# Patient Record
Sex: Male | Born: 1960
Health system: Southern US, Community
[De-identification: ages and names within clinical notes are randomized; demographics above are authoritative.]

## PROBLEM LIST (undated history)

## (undated) DIAGNOSIS — Z905 Acquired absence of kidney: Secondary | ICD-10-CM

---

## 1993-06-07 HISTORY — PX: NEPHRECTOMY LIVING DONOR: SUR877

## 2000-09-09 ENCOUNTER — Encounter: Admission: RE | Admit: 2000-09-09 | Discharge: 2000-09-09 | Payer: Self-pay | Admitting: Internal Medicine

## 2000-09-09 ENCOUNTER — Ambulatory Visit (HOSPITAL_COMMUNITY): Admission: RE | Admit: 2000-09-09 | Discharge: 2000-09-09 | Payer: Self-pay | Admitting: Internal Medicine

## 2000-09-09 ENCOUNTER — Encounter: Payer: Self-pay | Admitting: Internal Medicine

## 2000-10-07 ENCOUNTER — Encounter (INDEPENDENT_AMBULATORY_CARE_PROVIDER_SITE_OTHER): Payer: Self-pay | Admitting: Specialist

## 2000-10-07 ENCOUNTER — Ambulatory Visit (HOSPITAL_COMMUNITY): Admission: RE | Admit: 2000-10-07 | Discharge: 2000-10-07 | Payer: Self-pay | Admitting: Urology

## 2000-12-30 ENCOUNTER — Emergency Department (HOSPITAL_COMMUNITY): Admission: EM | Admit: 2000-12-30 | Discharge: 2000-12-30 | Payer: Self-pay

## 2000-12-30 ENCOUNTER — Encounter: Payer: Self-pay | Admitting: Emergency Medicine

## 2002-06-07 HISTORY — PX: SURGERY SCROTAL / TESTICULAR: SUR1316

## 2004-08-06 ENCOUNTER — Emergency Department (HOSPITAL_COMMUNITY): Admission: EM | Admit: 2004-08-06 | Discharge: 2004-08-06 | Payer: Self-pay | Admitting: Emergency Medicine

## 2006-02-06 ENCOUNTER — Emergency Department (HOSPITAL_COMMUNITY): Admission: EM | Admit: 2006-02-06 | Discharge: 2006-02-06 | Payer: Self-pay | Admitting: Emergency Medicine

## 2008-09-25 ENCOUNTER — Emergency Department (HOSPITAL_COMMUNITY): Admission: EM | Admit: 2008-09-25 | Discharge: 2008-09-25 | Payer: Self-pay | Admitting: Emergency Medicine

## 2010-10-23 NOTE — Op Note (Signed)
[facility]  Patient:    Dustin Perry, Dustin Perry                MRN: [id number] Proc. Date: 10/07/00 Adm. Date:  [id number] Attending:  Trisha Mangle                           Operative Report  PREOPERATIVE DIAGNOSIS:  Right scrotal mass.  POSTOPERATIVE DIAGNOSIS:  Benign adenomatoid tumor.  PROCEDURE:  Excision of right scrotal mass.  SURGEON:  Mark C. Vernie Ammons, M.D.  ANESTHESIA:  General.  DRAINS:  None.  ESTIMATED BLOOD LOSS:  Less than 5 cc.  SPECIMEN:  Lesion was sent to pathology.  COMPLICATIONS:  None.  INDICATIONS:  The patient is a 50 year old male with a mass felt in the lower pole of his right hemiscrotum.  It was found on ultrasound to be approximately 1 cm in size, round, and well circumscribed but solid, most consistent with some form of neoplasm.  Benign lesions are felt to be the most likely cause, but the possibility of malignancy was present.  This was discussed with the patient, and he has elected to proceed with surgical excision.  DESCRIPTION OF PROCEDURE:  After informed consent, the patient went to the major OR, placed on the table, and administered general anesthesia.  The scrotum and genitalia were sterilely prepped and draped, and a midline median raphe incision was made.  The right hemiscrotum was then entered and the tunica vaginalis and some mild inflammation that was somewhat adherent to the testicle.  I was able to palpable the lesion in the inferior pole associated fairly intimately with the tail of the epididymis. I therefore incised the tunica vaginalis over this region and used sharp and blunt technique to isolate the lesion.  I tied off all vascular channels leading to the area with 2-0 silk suture.  The lesion was excised and sent to pathology, and frozen section returned benign adenomatoid tumor.  Bleeding points were cauterized, and I closed the parietal tunica vaginalis with running locking 3-0  Vicryl suture.  I then injected 0.5% plain Marcaine in the subcutaneous tissue and closed that with a running locking 3-0 chromic suture.  The skin was closed with running 3-0 chromic.  Sterile occlusive dressing was applied as well as fluff 4 x 4s and a scrotal support.  The patient was awakened and taken to the recovery room in stable and satisfactory condition.  He tolerated the procedure well, and there were intraoperative complications.  He will be given a prescription for #40 Tylox and return to my office for a recheck in approximately four weeks. DD:  10/07/00 TD:  10/07/00 Job: [id number]

## 2011-03-25 ENCOUNTER — Inpatient Hospital Stay (INDEPENDENT_AMBULATORY_CARE_PROVIDER_SITE_OTHER)
Admission: RE | Admit: 2011-03-25 | Discharge: 2011-03-25 | Disposition: A | Discharge status: Home / Self Care | Payer: Self-pay | Source: Ambulatory Visit | Attending: Family Medicine | Admitting: Family Medicine

## 2011-03-25 DIAGNOSIS — K4090 Unilateral inguinal hernia, without obstruction or gangrene, not specified as recurrent: Secondary | ICD-10-CM

## 2011-03-25 LAB — POCT URINALYSIS DIP (DEVICE)
Bilirubin Urine: NEGATIVE
Glucose, UA: NEGATIVE mg/dL
Ketones, ur: NEGATIVE mg/dL
Leukocytes, UA: NEGATIVE
Nitrite: NEGATIVE
Protein, ur: 30 mg/dL — AB
Specific Gravity, Urine: >=1.03 (ref 1.005–1.030)
Urobilinogen, UA: 0.2 mg/dL (ref 0.0–1.0)
pH: 5.5 (ref 5.0–8.0)

## 2011-03-26 ENCOUNTER — Encounter (INDEPENDENT_AMBULATORY_CARE_PROVIDER_SITE_OTHER): Payer: Self-pay | Admitting: Surgery

## 2011-03-29 ENCOUNTER — Ambulatory Visit (INDEPENDENT_AMBULATORY_CARE_PROVIDER_SITE_OTHER): Payer: Self-pay | Admitting: Surgery

## 2011-04-05 ENCOUNTER — Ambulatory Visit (INDEPENDENT_AMBULATORY_CARE_PROVIDER_SITE_OTHER): Payer: Self-pay | Admitting: Surgery

## 2011-04-10 ENCOUNTER — Encounter (HOSPITAL_COMMUNITY): Payer: Self-pay | Admitting: Radiology

## 2011-04-10 ENCOUNTER — Emergency Department (HOSPITAL_COMMUNITY)
Admission: EM | Admit: 2011-04-10 | Discharge: 2011-04-10 | Disposition: A | Discharge status: Home / Self Care | Payer: Self-pay | Attending: Emergency Medicine | Admitting: Emergency Medicine

## 2011-04-10 ENCOUNTER — Emergency Department (HOSPITAL_COMMUNITY): Payer: Self-pay

## 2011-04-10 DIAGNOSIS — K4091 Unilateral inguinal hernia, without obstruction or gangrene, recurrent: Secondary | ICD-10-CM | POA: Insufficient documentation

## 2011-04-10 DIAGNOSIS — R109 Unspecified abdominal pain: Secondary | ICD-10-CM | POA: Insufficient documentation

## 2011-04-10 HISTORY — DX: Acquired absence of kidney: Z90.5

## 2011-04-10 LAB — URINALYSIS, ROUTINE W REFLEX MICROSCOPIC
Hgb urine dipstick: NEGATIVE
Nitrite: NEGATIVE
Specific Gravity, Urine: 1.017 (ref 1.005–1.030)
Urobilinogen, UA: 0.2 mg/dL (ref 0.0–1.0)
pH: 6 (ref 5.0–8.0)

## 2011-04-10 LAB — DIFFERENTIAL
Basophils Absolute: 0.1 10*3/uL (ref 0.0–0.1)
Basophils Relative: 1 % (ref 0–1)
Lymphocytes Relative: 34 % (ref 12–46)
Monocytes Absolute: 0.7 10*3/uL (ref 0.1–1.0)
Neutro Abs: 5.1 10*3/uL (ref 1.7–7.7)
Neutrophils Relative %: 53 % (ref 43–77)

## 2011-04-10 LAB — BASIC METABOLIC PANEL
BUN: 15 mg/dL (ref 6–23)
Chloride: 101 mEq/L (ref 96–112)
GFR calc Af Amer: >90 mL/min (ref >90)
GFR calc non Af Amer: >90 mL/min (ref >90)
Glucose, Bld: 101 mg/dL — ABNORMAL HIGH (ref 70–99)
Potassium: 3.5 mEq/L (ref 3.5–5.1)
Sodium: 136 mEq/L (ref 135–145)

## 2011-04-10 LAB — CBC
HCT: 39.2 % (ref 39.0–52.0)
Hemoglobin: 14.3 g/dL (ref 13.0–17.0)
MCHC: 36.5 g/dL — ABNORMAL HIGH (ref 30.0–36.0)
WBC: 9.6 10*3/uL (ref 4.0–10.5)

## 2011-04-10 MED ORDER — IOHEXOL 300 MG/ML  SOLN
100.0000 mL | Freq: Once | INTRAMUSCULAR | Status: AC | PRN
  Administered 2011-04-10: 100 mL via INTRAVENOUS

## 2011-04-26 ENCOUNTER — Ambulatory Visit (INDEPENDENT_AMBULATORY_CARE_PROVIDER_SITE_OTHER): Payer: Self-pay | Admitting: Surgery

## 2011-04-26 ENCOUNTER — Encounter (INDEPENDENT_AMBULATORY_CARE_PROVIDER_SITE_OTHER): Payer: Self-pay | Admitting: Surgery

## 2011-04-26 VITALS — BP 116/82 | HR 64 | Temp 97.6°F | Resp 18 | Ht 66.0 in | Wt 183.5 lb

## 2011-04-26 DIAGNOSIS — K4090 Unilateral inguinal hernia, without obstruction or gangrene, not specified as recurrent: Secondary | ICD-10-CM

## 2011-04-26 NOTE — Progress Notes (Signed)
Patient ID: Dustin Perry, male   DOB: Dec 08, 1960, 50 y.o.   MRN: [id number]  Chief Complaint  Patient presents with  . New Evaluation    eval of LIH     HPI Dustin Perry is a 50 y.o. male.   HPIThis is a gentleman referred by Dr. Angelia Mould for evaluation of left groin pain. The patient then having severe pain in his groin intermittently for over a month. He has not noticed a bulge himself. The pain is sharp and refer to his back as well as his left testicle. He has had no nausea vomiting or obstructive symptoms.  Past Medical History  Diagnosis Date  . History of nephrectomy     left, for donation    Past Surgical History  Procedure Date  . Nephrectomy living donor 9  . Surgery scrotal / testicular 2004    fatty mass removed     History reviewed. No pertinent family history.  Social History History  Substance Use Topics  . Smoking status: Never Smoker   . Smokeless tobacco: Never Used  . Alcohol Use: Yes    No Known Allergies  Current Outpatient Prescriptions  Medication Sig Dispense Refill  . Hydrocodone-Acetaminophen (VICODIN PO) Take by mouth. 5/300 mg 1 q 6 hr prn pain         Review of Systems Review of Systems  Constitutional: Negative for fever, chills and unexpected weight change.  HENT: Negative for hearing loss, congestion, sore throat, trouble swallowing and voice change.   Eyes: Negative for visual disturbance.  Respiratory: Negative for cough and wheezing.   Cardiovascular: Negative for chest pain, palpitations and leg swelling.  Gastrointestinal: Positive for abdominal pain. Negative for nausea, vomiting, diarrhea, constipation, blood in stool, abdominal distention, anal bleeding and rectal pain.  Genitourinary: Negative for hematuria and difficulty urinating.  Musculoskeletal: Negative for arthralgias.  Skin: Negative for rash and wound.  Neurological: Negative for seizures, syncope, weakness and headaches.  Hematological: Negative  for adenopathy. Does not bruise/bleed easily.  Psychiatric/Behavioral: Negative for confusion.    Blood pressure 116/82, pulse 64, temperature 97.6 F (36.4 C), temperature source Temporal, resp. rate 18, height 5\' 6"  (1.676 m), weight 183 lb 8 oz (83.235 kg).  Physical Exam Physical Exam  Constitutional: He is oriented to person, place, and time. He appears well-developed and well-nourished. No distress.  HENT:  Head: Normocephalic and atraumatic.  Right Ear: External ear normal.  Left Ear: External ear normal.  Nose: Nose normal.  Mouth/Throat: Oropharynx is clear and moist.  Eyes: Conjunctivae are normal. Pupils are equal, round, and reactive to light. No scleral icterus.  Neck: Normal range of motion. Neck supple. No JVD present. No tracheal deviation present. No thyromegaly present.  Cardiovascular: Normal rate, regular rhythm, normal heart sounds and intact distal pulses.   No murmur heard. Pulmonary/Chest: Effort normal and breath sounds normal. No respiratory distress. He has no wheezes.  Abdominal: Soft. Bowel sounds are normal. He exhibits no distension and no mass. There is no rebound and no guarding.       He has an easily reducible left inguinal hernia which is mildly tender. There is no evidence of right inguinal hernia. He has a well-healed large midline abdominal incision  Musculoskeletal: Normal range of motion. He exhibits no edema.  Lymphadenopathy:    He has no cervical adenopathy.  Neurological: He is alert and oriented to person, place, and time.  Skin: Skin is warm and dry. No rash noted. No erythema.  Psychiatric:  His behavior is normal. Judgment normal.    Data Reviewed I had a CAT scan of the abdomen and pelvis showing a left inguinal hernia containing only fat. He has had a previous nephrectomy. There are no other abnormalities  Assessment    Easily reducible left inguinal hernia    Plan    As he is quite symptomatic, repair is recommended with  mesh. Given his previous surgery I will need to do this open. I discussed the risks of surgery which includes but is not limited to bleeding, infection, injury to showing structures, nerve entrapment, chronic pain, recurrence, etc. He understands and wishes to proceed. Likelihood of success is good.       Kelty Szafran A 04/26/2011, 2:15 PM

## 2011-05-04 ENCOUNTER — Telehealth (INDEPENDENT_AMBULATORY_CARE_PROVIDER_SITE_OTHER): Payer: Self-pay | Admitting: Surgery

## 2011-05-04 NOTE — Telephone Encounter (Signed)
Called Dustin Perry back and told him that he need to go to a family Doctor to get on some med and maybe an antibodies and told pt to call me back to let me know what the Dr said

## 2011-05-10 ENCOUNTER — Telehealth (INDEPENDENT_AMBULATORY_CARE_PROVIDER_SITE_OTHER): Payer: Self-pay | Admitting: Surgery

## 2011-05-10 NOTE — Telephone Encounter (Signed)
Called pt back and talked to him and told him if he is sick that we will cancel his surgery on 05-14-11. Pt will go to urgent care on 05-10-11 to see Doctor, I will let the pt talk to Amy in scheduling to cancel surgery. Pt is aware that he will call back up here once he is feeling better to schedule surgery

## 2011-05-11 ENCOUNTER — Emergency Department (INDEPENDENT_AMBULATORY_CARE_PROVIDER_SITE_OTHER)
Admission: EM | Admit: 2011-05-11 | Discharge: 2011-05-11 | Disposition: A | Discharge status: Home / Self Care | Payer: Self-pay | Source: Home / Self Care | Attending: Family Medicine | Admitting: Family Medicine

## 2011-05-11 DIAGNOSIS — J019 Acute sinusitis, unspecified: Secondary | ICD-10-CM

## 2011-05-11 MED ORDER — AMOXICILLIN-POT CLAVULANATE 875-125 MG PO TABS: 1.0000 | ORAL_TABLET | Freq: Two times a day (BID) | ORAL | Status: AC

## 2011-05-11 MED ORDER — FLUTICASONE PROPIONATE 50 MCG/ACT NA SUSP: 2.0000 | Freq: Every day | NASAL | Status: DC

## 2011-05-11 NOTE — ED Provider Notes (Signed)
History     CSN: [id number] Arrival date & time: 05/11/2011  1:03 PM   First MD Initiated Contact with Patient 05/11/11 1325      Chief Complaint  Patient presents with  . Sinusitis    (Consider location/radiation/quality/duration/timing/severity/associated sxs/prior treatment) Patient is a 50 y.o. male presenting with sinusitis. The history is provided by the patient.  Sinusitis  This is a chronic problem. The current episode started more than 1 week ago. The problem has been gradually worsening. There has been no fever. The pain is mild. Associated symptoms include congestion and sinus pressure.    Past Medical History  Diagnosis Date  . History of nephrectomy     left, for donation    Past Surgical History  Procedure Date  . Nephrectomy living donor 4  . Surgery scrotal / testicular 2004    fatty mass removed     No family history on file.  History  Substance Use Topics  . Smoking status: Never Smoker   . Smokeless tobacco: Never Used  . Alcohol Use: Yes      Review of Systems  HENT: Positive for congestion and sinus pressure.   Respiratory: Negative.   Cardiovascular: Negative.   Skin: Negative.     Allergies  Review of patient's allergies indicates no known allergies.  Home Medications   Current Outpatient Rx  Name Route Sig Dispense Refill  . VICODIN PO Oral Take by mouth. 5/300 mg 1 q 6 hr prn pain       BP 145/95  Pulse 68  Temp(Src) 97.6 F (36.4 C) (Oral)  Resp 16  SpO2 99%  Physical Exam  Nursing note and vitals reviewed. Constitutional: He appears well-developed and well-nourished.  HENT:  Head: Normocephalic.  Right Ear: External ear normal.  Left Ear: External ear normal.  Nose: Mucosal edema and rhinorrhea present. Right sinus exhibits maxillary sinus tenderness. Left sinus exhibits maxillary sinus tenderness.  Mouth/Throat: Oropharynx is clear and moist.  Eyes: Conjunctivae are normal. Pupils are equal, round, and reactive  to light.  Neck: Normal range of motion. Neck supple.    ED Course  Procedures (including critical care time)  Labs Reviewed - No data to display No results found.   No diagnosis found.    MDM          Barkley Bruns, MD 05/11/11 1430

## 2011-05-11 NOTE — ED Notes (Signed)
Pt here to get cleared for surgery for hernia repair but due to sinus infection unable to get cleared until infection gone

## 2012-01-11 IMAGING — CT CT ABD-PELV W/ CM
2 of 5 series · 17 of 46 positions shown, 19 images · IV contrast (omnipaque)
Comparison: None.

CLINICAL DATA: Left lower quadrant pain

CT ABDOMEN AND PELVIS WITH CONTRAST
TECHNIQUE: Multidetector CT imaging of the abdomen and pelvis was
performed following the standard protocol during bolus
administration of intravenous contrast.
Contrast: 100mL OMNIPAQUE IOHEXOL 300 MG/ML IV SOLN

[Series 2: routine abdomen · axial · 0.79mm/px · z∈[-461,-36]mm · 14 of 96 slices shown, 16 images]
[im 6/96  soft-tissue]
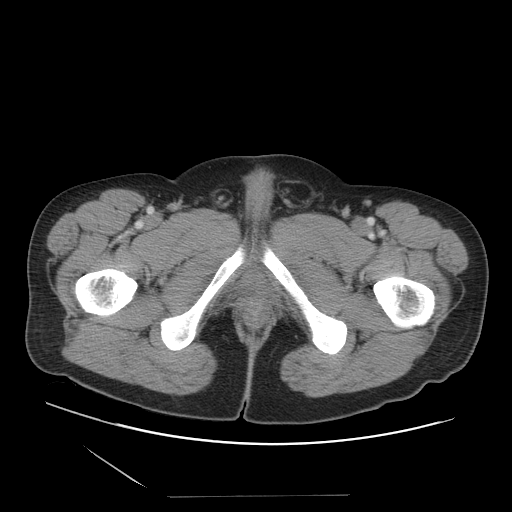
[im 6/96  bone]
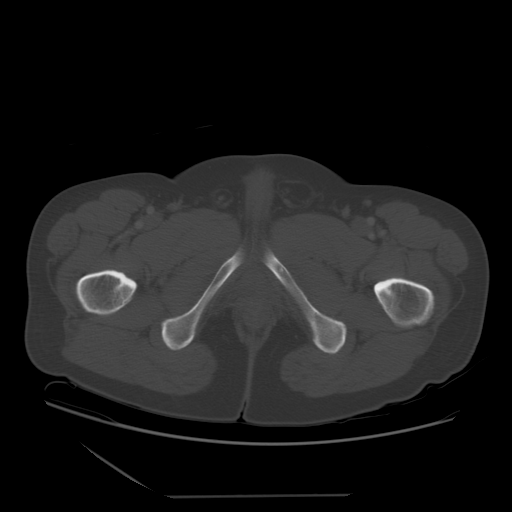
[im 11/96  soft-tissue]
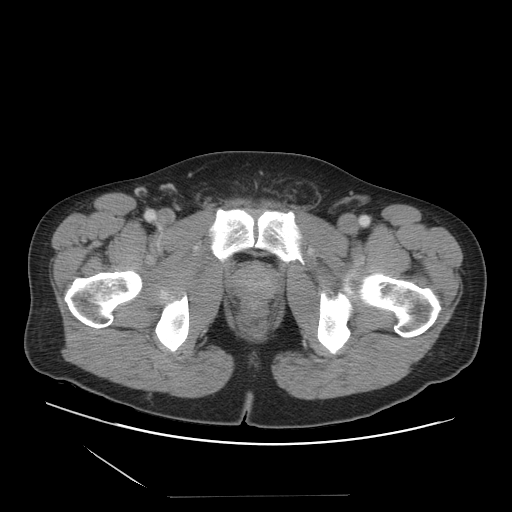
[im 21/96  soft-tissue]
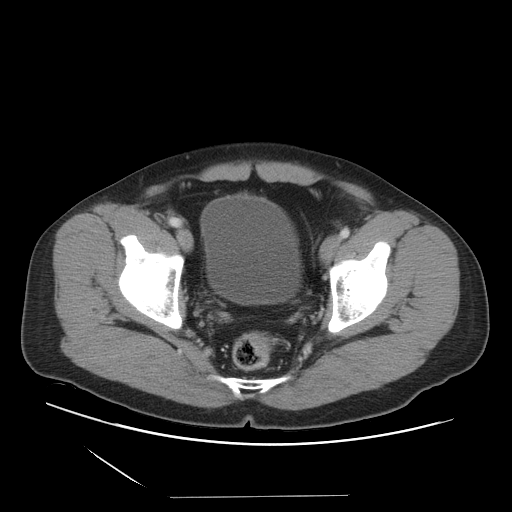
[im 26/96  soft-tissue]
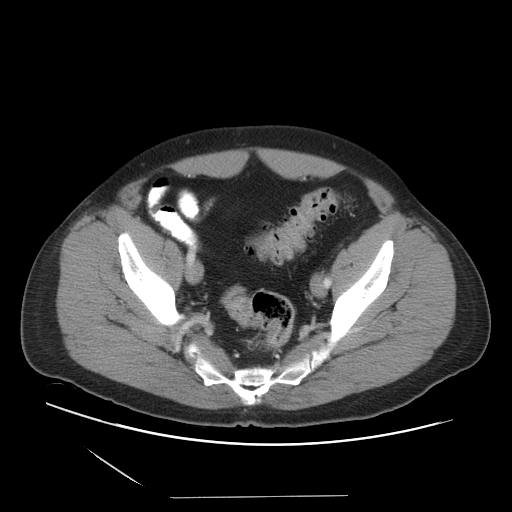
[im 31/96  soft-tissue]
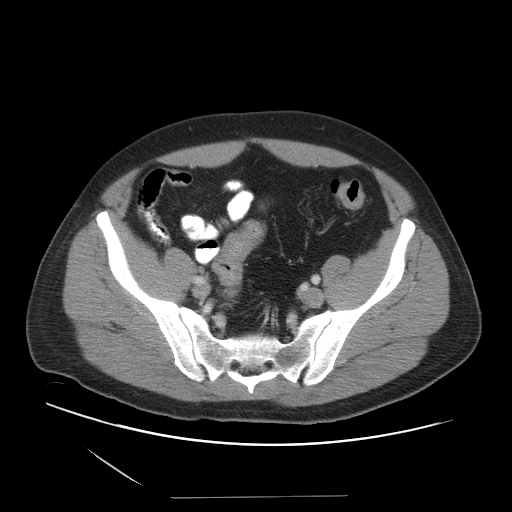
[im 41/96  soft-tissue]
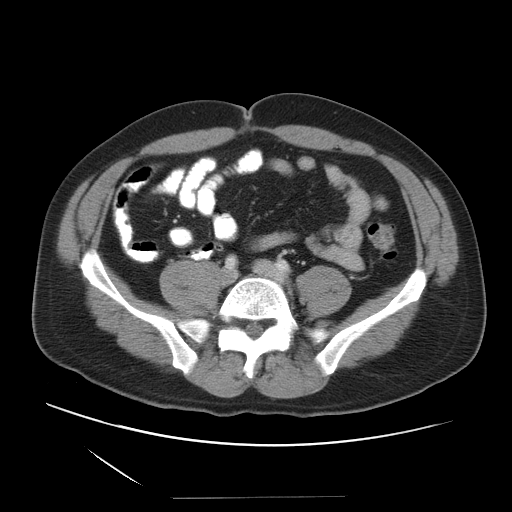
[im 46/96  soft-tissue]
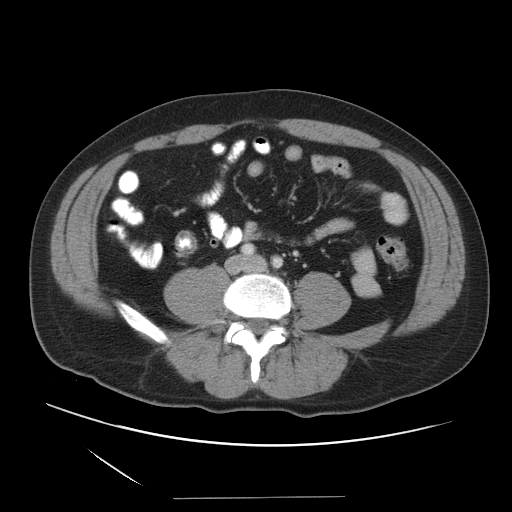
[im 51/96  soft-tissue]
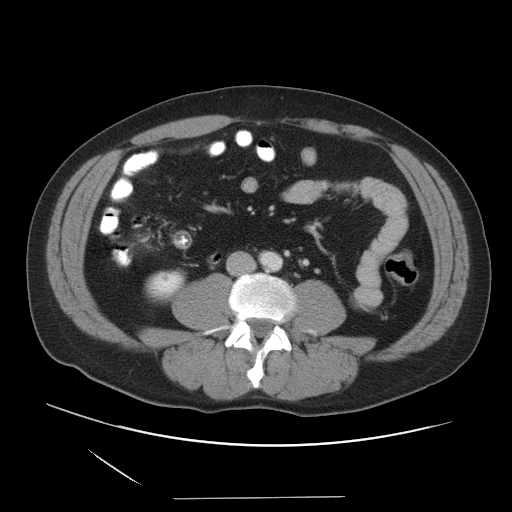
[im 56/96  soft-tissue]
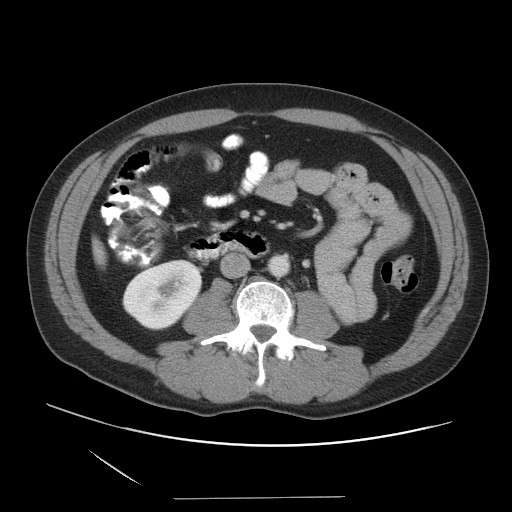
[im 56/96  bone]
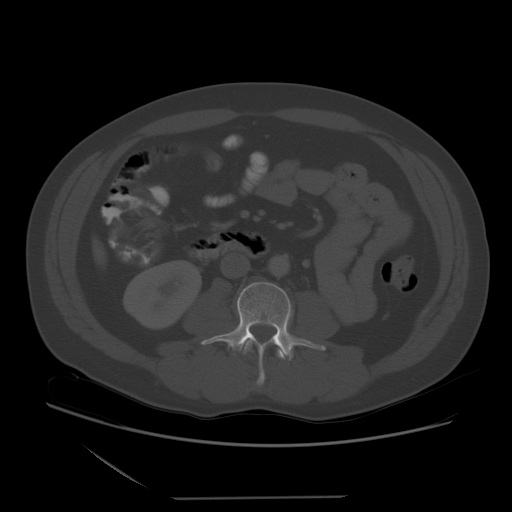
[im 66/96  soft-tissue]
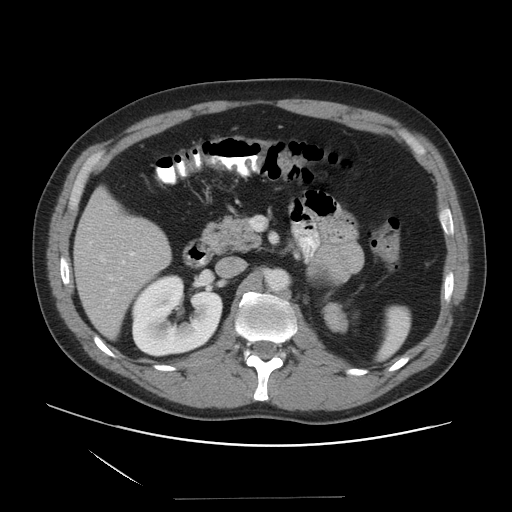
[im 71/96  soft-tissue]
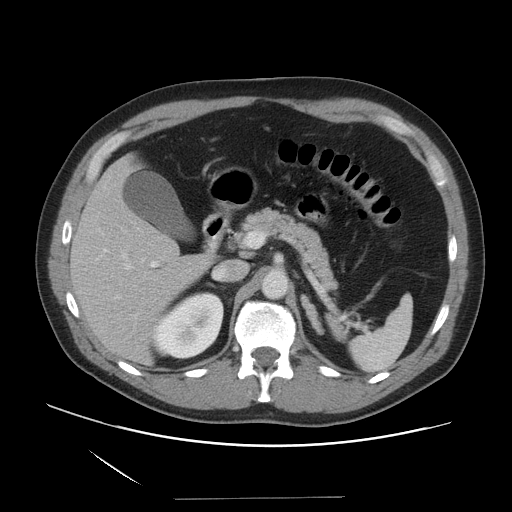
[im 76/96  soft-tissue]
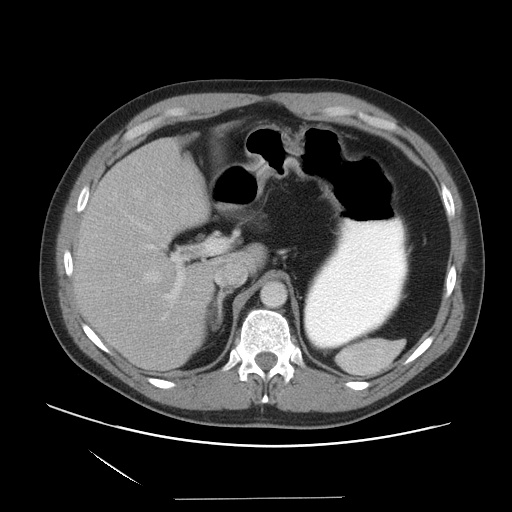
[im 86/96  soft-tissue]
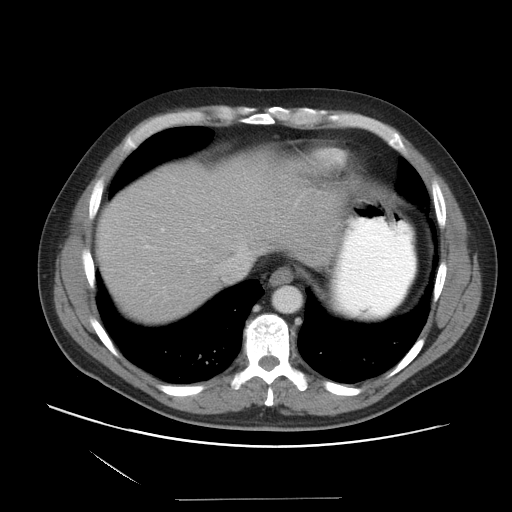
[im 91/96  soft-tissue]
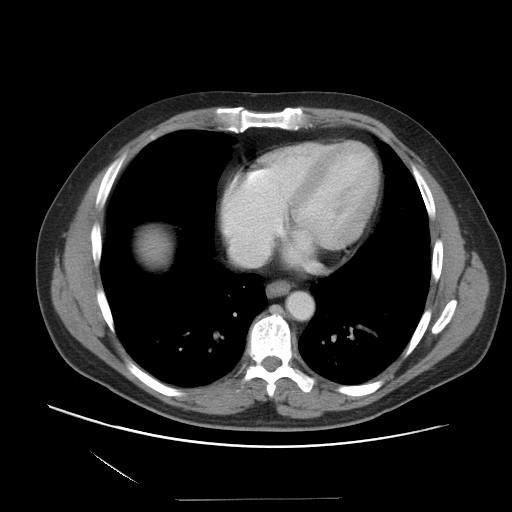

[Series 400: cor · coronal · 0.96mm/px · 3 of 95 slices shown]
[im 32/95  soft-tissue]
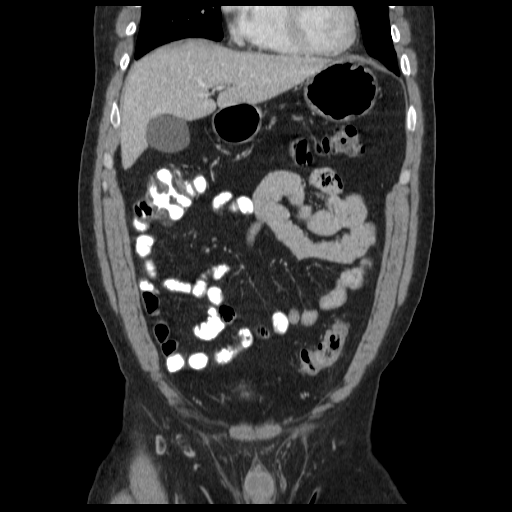
[im 42/95  soft-tissue]
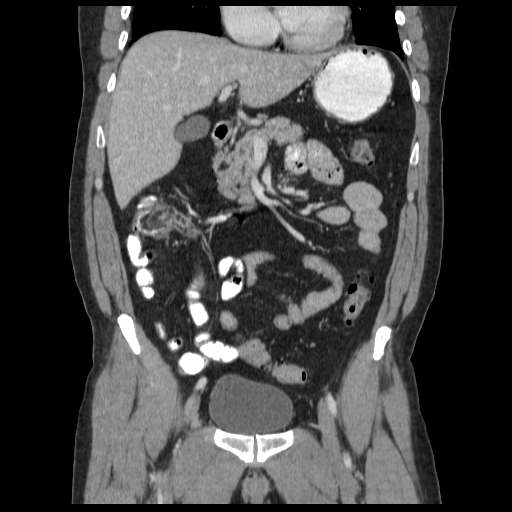
[im 53/95  soft-tissue]
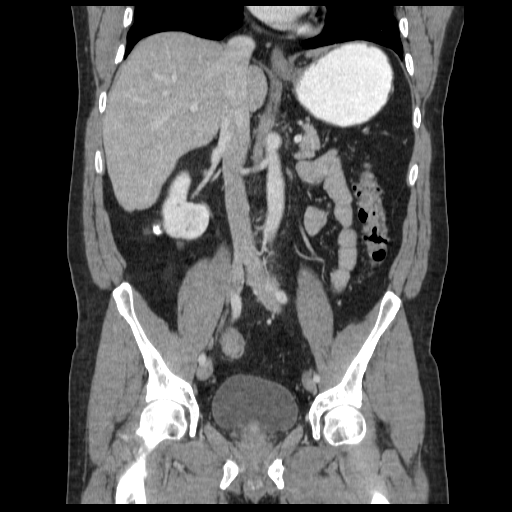

[17 of 46 positions shown; findings below may reference images not displayed]

FINDINGS: Dependent bibasilar atelectasis.  Normal heart size.  No
pericardial or pleural effusion.  No hiatal hernia.

Abdomen:  Liver, gallbladder, biliary system, pancreas, spleen,
right adrenal gland, and right kidney are within normal limits for
age.  Right kidney demonstrates an incidental small hypodense lower
pole cysts measuring 8 mm, image 44.  The patient is status post
left nephrectomy.  Slight nonspecific nodular thickening of the
left adrenal gland, image 26.

No bowel obstruction pattern, dilatation, ileus, or free air.
Stool throughout the transverse colon.  Fatty proliferation of the
ileocecal valve evident image 42.  Fatty infiltration of the
terminal ileum wall, image 48 consistent with prior
infectious/inflammatory process.  This is not acute.  Unremarkable
appendix containing air.  Diverticulosis of the descending colon
and sigmoid.

Pelvis:  Diverticulosis the sigmoid again evident.  No definite
acute inflammatory process.  No free fluid, fluid collection,
hemorrhage, abscess, adenopathy or inguinal abnormality.  Small fat
containing left inguinal hernia evident.

Slight degenerative changes of the spine.  No compression fracture.
IMPRESSION: no acute intra-abdominal or pelvic process.

Colonic diverticulosis.

Previous left nephrectomy

Sub centimeter right lower pole renal cysts

## 2014-08-11 ENCOUNTER — Encounter (HOSPITAL_COMMUNITY): Payer: Self-pay

## 2014-08-11 ENCOUNTER — Emergency Department (INDEPENDENT_AMBULATORY_CARE_PROVIDER_SITE_OTHER)
Admission: EM | Admit: 2014-08-11 | Discharge: 2014-08-11 | Disposition: A | Discharge status: Home / Self Care | Payer: Self-pay | Source: Home / Self Care | Attending: Emergency Medicine | Admitting: Emergency Medicine

## 2014-08-11 DIAGNOSIS — M25462 Effusion, left knee: Secondary | ICD-10-CM

## 2014-08-11 DIAGNOSIS — M25562 Pain in left knee: Secondary | ICD-10-CM

## 2014-08-11 MED ORDER — DICLOFENAC SODIUM 75 MG PO TBEC: 75.0000 mg | DELAYED_RELEASE_TABLET | Freq: Two times a day (BID) | ORAL | Status: DC

## 2014-08-11 MED ORDER — HYDROCODONE-ACETAMINOPHEN 5-325 MG PO TABS: 1.0000 | ORAL_TABLET | Freq: Four times a day (QID) | ORAL | Status: DC | PRN

## 2014-08-11 NOTE — ED Notes (Signed)
States he spent time on his knee a couple of days ago, and since then has had pain and swelling in left knee

## 2014-08-11 NOTE — ED Provider Notes (Signed)
CSN: [id number]     Arrival date & time 08/11/14  4801 History   First MD Initiated Contact with Patient 08/11/14 1107     Chief Complaint  Patient presents with  . Knee Pain   (Consider location/radiation/quality/duration/timing/severity/associated sxs/prior Treatment) HPI  He is a 54 year old man here for evaluation of left knee pain. He states a few days ago he was kneeling down at work and felt a pop when he stood up. Since then he has had sharp pain around his knee. He also reports fairly immediate swelling. Pain is worse when he flexes the knee. No catching or locking. No sense that the knee is going to give out. He states he had similar pain many years ago after a soccer injury.  Past Medical History  Diagnosis Date  . History of nephrectomy     left, for donation   Past Surgical History  Procedure Laterality Date  . Nephrectomy living donor  55  . Surgery scrotal / testicular  2004    fatty mass removed    History reviewed. No pertinent family history. History  Substance Use Topics  . Smoking status: Never Smoker   . Smokeless tobacco: Never Used  . Alcohol Use: Yes    Review of Systems As in history of present illness Allergies  Review of patient's allergies indicates no known allergies.  Home Medications   Prior to Admission medications   Medication Sig Start Date End Date Taking? Authorizing Provider  diclofenac (VOLTAREN) 75 MG EC tablet Take 1 tablet (75 mg total) by mouth 2 (two) times daily. For 1 week, then as needed. 08/11/14   Melony Overly, MD  HYDROcodone-acetaminophen (NORCO) 5-325 MG per tablet Take 1 tablet by mouth every 6 (six) hours as needed for moderate pain. 08/11/14   Melony Overly, MD   BP 138/90 mmHg  Pulse 72  Temp(Src) 97.9 F (36.6 C) (Oral)  Resp 18  SpO2 96% Physical Exam  Constitutional: He is oriented to person, place, and time. He appears well-developed and well-nourished. No distress.  Cardiovascular: Normal rate.    Pulmonary/Chest: Effort normal.  Musculoskeletal:  Left knee: No erythema. He does have a small joint effusion. No point tenderness. No MCL, LCL, anterior cruciate ligament laxity. McMurray's is negative.  Neurological: He is alert and oriented to person, place, and time.    ED Course  Procedures (including critical care time) Labs Review Labs Reviewed - No data to display  Imaging Review No results found.   MDM   1. Left knee pain   2. Knee effusion, left    His knee exam is normal except for the effusion. Given the pain he reports an effusion, I suspect he has a meniscal tear. I offered a cortisone injection, he has declined this. We'll treat with diclofenac and frequent icing. Norco provided to use as needed for severe pain. He will follow-up with orthopedic surgery if no improvement in the next 5 days.    Melony Overly, MD 08/11/14 1136

## 2014-08-11 NOTE — Discharge Instructions (Signed)
I think you likely tore your meniscus. Take diclofenac 1 pill twice a day for the next week, then as needed. Do not take ibuprofen with this medicine. Ice your knee as often as you can for the next 2 days. Use the Norco as needed for severe pain. This will make you sleepy. Do not take while driving. If your pain is not better in the next 5 days, please make an appointment with the orthopedic doctor.

## 2014-12-15 ENCOUNTER — Encounter (HOSPITAL_COMMUNITY): Payer: Self-pay | Admitting: *Deleted

## 2014-12-15 ENCOUNTER — Emergency Department (INDEPENDENT_AMBULATORY_CARE_PROVIDER_SITE_OTHER)
Admission: EM | Admit: 2014-12-15 | Discharge: 2014-12-15 | Disposition: A | Discharge status: Home / Self Care | Payer: Self-pay | Source: Home / Self Care | Attending: Emergency Medicine | Admitting: Emergency Medicine

## 2014-12-15 ENCOUNTER — Emergency Department (INDEPENDENT_AMBULATORY_CARE_PROVIDER_SITE_OTHER): Payer: Self-pay

## 2014-12-15 DIAGNOSIS — S93402A Sprain of unspecified ligament of left ankle, initial encounter: Secondary | ICD-10-CM

## 2014-12-15 MED ORDER — DICLOFENAC SODIUM 75 MG PO TBEC: 75.0000 mg | DELAYED_RELEASE_TABLET | Freq: Two times a day (BID) | ORAL | Status: DC

## 2014-12-15 MED ORDER — HYDROCODONE-ACETAMINOPHEN 5-325 MG PO TABS: 1.0000 | ORAL_TABLET | Freq: Four times a day (QID) | ORAL | Status: DC | PRN

## 2014-12-15 NOTE — ED Provider Notes (Signed)
CSN: [id number]     Arrival date & time 12/15/14  1651 History   First MD Initiated Contact with Patient 12/15/14 1809     Chief Complaint  Patient presents with  . Ankle Injury   (Consider location/radiation/quality/duration/timing/severity/associated sxs/prior Treatment) Patient is a 54 y.o. male presenting with lower extremity injury. The history is provided by the patient. No language interpreter was used.  Ankle Injury This is a new problem. The problem occurs constantly. The problem has been gradually worsening. Associated symptoms include headaches. Nothing aggravates the symptoms. Nothing relieves the symptoms. He has tried nothing for the symptoms. The treatment provided no relief.  Pt reports he turned his ankle a week ago in the woods.  Pt complains of swelling and pain.  Past Medical History  Diagnosis Date  . History of nephrectomy     left, for donation   Past Surgical History  Procedure Laterality Date  . Nephrectomy living donor  35  . Surgery scrotal / testicular  2004    fatty mass removed    History reviewed. No pertinent family history. History  Substance Use Topics  . Smoking status: Never Smoker   . Smokeless tobacco: Never Used  . Alcohol Use: Yes    Review of Systems  Neurological: Positive for headaches.  All other systems reviewed and are negative.   Allergies  Review of patient's allergies indicates no known allergies.  Home Medications   Prior to Admission medications   Medication Sig Start Date End Date Taking? Authorizing Provider  diclofenac (VOLTAREN) 75 MG EC tablet Take 1 tablet (75 mg total) by mouth 2 (two) times daily. For 1 week, then as needed. 08/11/14   Melony Overly, MD  HYDROcodone-acetaminophen (NORCO) 5-325 MG per tablet Take 1 tablet by mouth every 6 (six) hours as needed for moderate pain. 08/11/14   Melony Overly, MD   BP 151/88 mmHg  Pulse 87  Temp(Src) 97.7 F (36.5 C) (Oral)  Resp 14  SpO2 96% Physical Exam   Constitutional: He appears well-developed and well-nourished.  Musculoskeletal: He exhibits tenderness.  Tender left ankle, pain with range of motion,  nv and ns intact.   Neurological: He is alert.  Skin: Skin is warm.  Psychiatric: He has a normal mood and affect.  Nursing note reviewed.   ED Course  Procedures (including critical care time) Labs Review Labs Reviewed - No data to display  Imaging Review Dg Ankle Complete Left  12/15/2014   CLINICAL DATA:  Acute left ankle swelling after rolling injury on trail. Initial encounter.  EXAM: LEFT ANKLE COMPLETE - 3+ VIEW  COMPARISON:  None.  FINDINGS: There is no evidence of fracture, dislocation, or joint effusion. There is no evidence of arthropathy or other focal bone abnormality. Soft tissues are unremarkable.  IMPRESSION: Normal left ankle.   Electronically Signed   By: Marijo Conception, M.D.   On: 12/15/2014 18:27     MDM aso, crutches.      1. Ankle sprain, left, initial encounter     voltaren Hydrocodone Schedule to see Dr. Lorin Mercy if pain persist past one week.    [place], PA-C 12/15/14 1950

## 2014-12-15 NOTE — Discharge Instructions (Signed)

## 2014-12-15 NOTE — ED Notes (Signed)
Pt  injured  His  l  Ankle  4-5  Days  Ago  And    Has  Been  Standing  And  Working  On the  Affected  Foot     Since        pt  Reports   Swelling  To  The  Area  As  Well      Pain is  increased  On  Weight  bearing

## 2014-12-15 NOTE — ED Notes (Signed)
Med  aso       And  Crutches    Done

## 2015-03-03 ENCOUNTER — Emergency Department (INDEPENDENT_AMBULATORY_CARE_PROVIDER_SITE_OTHER)
Admission: EM | Admit: 2015-03-03 | Discharge: 2015-03-03 | Disposition: A | Discharge status: Other Acute Inpatient Hospital | Payer: Self-pay | Source: Home / Self Care | Attending: Family Medicine | Admitting: Family Medicine

## 2015-03-03 ENCOUNTER — Emergency Department (HOSPITAL_COMMUNITY)
Admission: EM | Admit: 2015-03-03 | Discharge: 2015-03-03 | Disposition: A | Discharge status: Home / Self Care | Payer: Self-pay | Attending: Emergency Medicine | Admitting: Emergency Medicine

## 2015-03-03 ENCOUNTER — Encounter (HOSPITAL_COMMUNITY): Payer: Self-pay | Admitting: Family Medicine

## 2015-03-03 ENCOUNTER — Encounter (HOSPITAL_COMMUNITY): Payer: Self-pay | Admitting: *Deleted

## 2015-03-03 DIAGNOSIS — K4030 Unilateral inguinal hernia, with obstruction, without gangrene, not specified as recurrent: Secondary | ICD-10-CM

## 2015-03-03 DIAGNOSIS — K4090 Unilateral inguinal hernia, without obstruction or gangrene, not specified as recurrent: Secondary | ICD-10-CM | POA: Insufficient documentation

## 2015-03-03 MED ORDER — HYDROCODONE-ACETAMINOPHEN 5-325 MG PO TABS: 1.0000 | ORAL_TABLET | Freq: Four times a day (QID) | ORAL | Status: DC | PRN

## 2015-03-03 NOTE — Care Management Note (Signed)
Case Management Note  Patient Details  Name: Dustin Perry MRN: [id number] Date of Birth: 27-Aug-1960  Subjective/Objective:                  54 yo male in ER with pain and large mass to R groin, was seen at Urgent Care and diagnosed with R sided inguinal hernia. //Home with family.  Action/Plan: Follow for disposition needs.  Expected Discharge Date:        03/03/15          Expected Discharge Plan:  Home/Self Care  In-House Referral:  PCP / [facility]  Discharge planning Services  CM Consult, Follow-up appt scheduled  Post Acute Care Choice:  NA Choice offered to:  NA  DME Arranged:  N/A DME Agency:  NA  HH Arranged:  NA HH Agency:  NA  Status of Service:  Completed, signed off  Medicare Important Message Given:    Date Medicare IM Given:    Medicare IM give by:    Date Additional Medicare IM Given:    Additional Medicare Important Message give by:     If discussed at [place] of Stay Meetings, dates discussed:    Additional Comments: Camellia J. Clydene Laming, RN, BSN, [place] [phone] ED CM consulted regarding PCP establishment and insurance enrollment. Pt presented to [facility] ED today with pain. NCM met with pt at bedside; pt confirms not having access to f/u care with PCP or insurance coverage. Discussed with patient importance and benefits of establishing PCP, and not utilizing the ED for primary care needs. Pt verbalized understanding and is in agreement. Discussed other options, provided list of local  affordable PCPs.  Pt voiced interest in the [facility] and [facility].  NCM advised that [facility]  Internal Medicine providers are seeing pts at [facility]. Pt verbalized understanding. NCM set up appointment with Cammie Sickle, NP 10/10 at 2:15.  Fuller Mandril, RN 03/03/2015, 3:13 PM

## 2015-03-03 NOTE — ED Notes (Signed)
Pt reports pain and large mass to R groin, was seen at Urgent Care and diagnosed with R sided inguinal hernia. R sided hernia was reduced before arrival to ED. Pt to speak with social work about surgical options. Denies pain at present.

## 2015-03-03 NOTE — ED Provider Notes (Signed)
CSN: [id number]     Arrival date & time 03/03/15  1302 History   None    Chief Complaint  Patient presents with  . Abdominal Pain   (Consider location/radiation/quality/duration/timing/severity/associated sxs/prior Treatment) Patient is a 54 y.o. male presenting with abdominal pain. The history is provided by the patient.  Abdominal Pain Pain location:  Suprapubic Pain quality: shooting and squeezing   Pain severity:  Moderate Duration:  4 days Progression:  Worsening Chronicity:  New Context comment:  Improves with lying down , worse with standing or walking Associated symptoms: nausea   Associated symptoms: no dysuria, no fever and no vomiting     Past Medical History  Diagnosis Date  . History of nephrectomy     left, for donation   Past Surgical History  Procedure Laterality Date  . Nephrectomy living donor  52  . Surgery scrotal / testicular  2004    fatty mass removed    No family history on file. Social History  Substance Use Topics  . Smoking status: Never Smoker   . Smokeless tobacco: Never Used  . Alcohol Use: Yes    Review of Systems  Constitutional: Negative for fever.  Gastrointestinal: Positive for nausea and abdominal pain. Negative for vomiting.  Genitourinary: Positive for scrotal swelling. Negative for dysuria.  All other systems reviewed and are negative.   Allergies  Review of patient's allergies indicates no known allergies.  Home Medications   Prior to Admission medications   Medication Sig Start Date End Date Taking? Authorizing Tyona Nilsen  diclofenac (VOLTAREN) 75 MG EC tablet Take 1 tablet (75 mg total) by mouth 2 (two) times daily. For 1 week, then as needed. 12/15/14   Fransico Meadow, PA-C  HYDROcodone-acetaminophen (NORCO) 5-325 MG per tablet Take 1 tablet by mouth every 6 (six) hours as needed for moderate pain. 12/15/14   Fransico Meadow, PA-C   Meds Ordered and Administered this Visit  Medications - No data to display  BP 136/85  mmHg  Pulse 73  Temp(Src) 98 F (36.7 C) (Oral)  Resp 16  SpO2 98% No data found.   Physical Exam  Constitutional: He is oriented to person, place, and time. He appears well-developed and well-nourished. He appears distressed.  Abdominal: Soft. Bowel sounds are normal. He exhibits mass. There is tenderness.  Acutely tender right inguinal hernia swelling into scrotum.  Neurological: He is alert and oriented to person, place, and time.  Skin: Skin is warm and dry.  Nursing note and vitals reviewed.   ED Course  Procedures (including critical care time)  Labs Review Labs Reviewed - No data to display  Imaging Review No results found.   Visual Acuity Review  Right Eye Distance:   Left Eye Distance:   Bilateral Distance:    Right Eye Near:   Left Eye Near:    Bilateral Near:         MDM   1. Strangulated inguinal hernia    Sent for eval of strangulated right inguinal hernia.    Billy Fischer, MD 03/03/15 1350

## 2015-03-03 NOTE — ED Provider Notes (Addendum)
CSN: [id number]     Arrival date & time 03/03/15  1418 History   First MD Initiated Contact with Patient 03/03/15 1430     Chief Complaint  Patient presents with  . Inguinal Hernia     (Consider location/radiation/quality/duration/timing/severity/associated sxs/prior Treatment) HPI Comments: Patient is a 54 year old male status post left kidney donation but otherwise healthy takes no medications on a regular basis presents today with worsening swelling in his right groin. He's noticed it for months but in the last 5 days it seemed to get a little bit bigger and has been more uncomfortable. It is always much worse when attempting to have a bowel movement or lifting. He works in [employer] and stands all day. He occasionally does lifting but no excessive lifting. The pain is much worse when he spreads pizza dough so in the last week or 2 he has stopped doing that. Normally he states the pain improves when he lays down and puts an ice pack on his groin and the swelling goes away. He went to urgent care today and they felt that he could've had a strangulated hernia and sent him here for further evaluation. Only he denies any abdominal pain and states the swelling is now improved.  The history is provided by the patient.    Past Medical History  Diagnosis Date  . History of nephrectomy     left, for donation   Past Surgical History  Procedure Laterality Date  . Nephrectomy living donor  19  . Surgery scrotal / testicular  2004    fatty mass removed    History reviewed. No pertinent family history. Social History  Substance Use Topics  . Smoking status: Never Smoker   . Smokeless tobacco: Never Used  . Alcohol Use: Yes    Review of Systems  All other systems reviewed and are negative.     Allergies  Review of patient's allergies indicates no known allergies.  Home Medications   Prior to Admission medications   Medication Sig Start Date End Date Taking? Authorizing Provider   diclofenac (VOLTAREN) 75 MG EC tablet Take 1 tablet (75 mg total) by mouth 2 (two) times daily. For 1 week, then as needed. 12/15/14   Fransico Meadow, PA-C  HYDROcodone-acetaminophen (NORCO) 5-325 MG per tablet Take 1 tablet by mouth every 6 (six) hours as needed for moderate pain. 12/15/14   Fransico Meadow, PA-C   There were no vitals taken for this visit. Physical Exam  Constitutional: He is oriented to person, place, and time. He appears well-developed and well-nourished. No distress.  HENT:  Head: Normocephalic and atraumatic.  Eyes: EOM are normal. Pupils are equal, round, and reactive to light.  Cardiovascular: Normal rate.   Pulmonary/Chest: Effort normal.  Abdominal: A hernia is present. Hernia confirmed positive in the right inguinal area.    Well-healed midline abdominal scar  Neurological: He is alert and oriented to person, place, and time. No cranial nerve deficit. Coordination normal.  Skin: Skin is warm and dry. No rash noted. No erythema.  Psychiatric: He has a normal mood and affect. His behavior is normal.  Nursing note and vitals reviewed.   ED Course  Procedures (including critical care time) Labs Review Labs Reviewed - No data to display  Imaging Review No results found. I have personally reviewed and evaluated these images and lab results as part of my medical decision-making.   EKG Interpretation None      MDM   Final diagnoses:  Right inguinal hernia    Patient is a 54 year old Hispanic gentleman with no medical problems with past surgical history of nephrectomy for donation presents today from urgent care with a right-sided inguinal hernia. Patient has noticed a hernia for some time but it's worse with prolonged straining or standing. In the last 5 days he's noticed the bulge in his groin getting and it was more painful today.  Patient is having normal bowel movements and no vomiting. He currently is not having pain in his groin. On exam patient  does have a right inguinal hernia that's completely reducible without signs of strangulation or incarceration. Discussed with him the importance of following up with general surgery for repair but this time do not feel that patient needs imaging or lab work as he is well-appearing with no complaints.  Patient given strict return precautions if the hernia becomes incarcerated. Given him restrictions to activities to prevent excessive straining. Social work spoke with the patient as he does not have insurance and will need to follow-up.    Blanchie Dessert, MD 03/03/15 9509  Blanchie Dessert, MD 03/03/15 1536

## 2015-03-03 NOTE — ED Notes (Signed)
Pt here for possible strangulated hernia in right groin area. Tender and swelling into scrotum.

## 2015-03-03 NOTE — Progress Notes (Signed)
[facility] [phone]  Spoke to patient regarding the [facility] orange card. Orange card application provided and explained, pt instructed to contact me once application is complete for an eligibility appointment. My contact information also given for any future questions or concerns. No other needs identified at this time.

## 2015-03-03 NOTE — Discharge Instructions (Signed)
Hernia inguinal - Adultos  (Inguinal Hernia, Adult)  Los msculos mantienen todos los rganos del cuerpo en Geneticist, molecular. Pero si se produce un punto dbil [employer], algunos pueden protruir. Eso se llama hernia. Cuando esto sucede en la parte inferior del vientre (abdomen), se trata de una hernia inguinal. (Toma su nombre de una parte del cuerpo que en esta regin se llamada canal inguinal). Un punto dbil en la pared de los msculos deja que un poco de grasa o parte del intestino delgado salgan hacia afuera. Una hernia inguinal puede desarrollarse a cualquier edad. Los hombres la sufren con ms frecuencia que las mujeres.  CAUSAS  En los adultos, la hernia inguinal desarrolla con el tiempo.   Las causas pueden ser:  Un esfuerzo sbito de los msculos de la parte inferior del abdomen.  Levantar objetos pesados.  Dificultad para mover el intestino. La dificultad para mover el intestino (constipacin) puede llevar a una hernia.  Tos constante. La causa puede ser el tabaquismo o una enfermedad pulmonar.  Tener sobrepeso.  El [place].  Tener un empleo que requiera Equities trader perodos de pie o levantar objetos pesados.  Haber sufrido de una hernia inguinal anteriormente. En algunos casos puede convertirse en una situacin de Freight forwarder. Cuando esto ocurre, se llama hernia inguinal estrangulada. Se produce cuando una parte del intestino delgado se desliza a travs del punto dbil y no puede volver al abdomen. El flujo de [place] puede interrumpirse. Si esto ocurre, una parte del intestino puede morir. Esta situacin requiere [place].  SNTOMAS  Generalmente una hernia inguinal pequea no tiene sntomas. Se diagnostica cuando un profesional de la salud hace un examen fsico. Las hernias ms grandes generalmente presentan sntomas.   En los adultos, los sntomas incluyen:  Un bulto en la ingle. Es fcil de Hydrographic surveyor cuando la persona est de pie. Puede  desaparecer al [place] Proud.  Los hombres pueden tener un bulto Geographical information systems officer.  Dolor o ardor en la ingle. Esto ocurre especialmente al levantar objetos, realizar un esfuerzo o toser.  Dolor sordo o sensacin de presin en la ingle.  Los signos de una hernia estrangulada pueden ser:  Ardelia Mems protuberancia en la ingle que duele mucho y est sensible al tacto.  Un bulto que se vuelve de color rojo o prpura.  Cristy Hilts, nuseas y vmitos.  Imposibilidad de evacuar el intestino o de eliminar gases. DIAGNSTICO  Para diagnosticar una hernia inguinal, el profesional le har un examen fsico.   Incluir preguntas acerca de los sntomas que haya notado.  El mdico palpar el rea de la ingle y le pedir que tosa. Si palpa una hernia inguinal, el mdico podr tratar de deslizarla de nuevo hacia adentro el abdomen.  Por lo general no se necesitan otros estudios. TRATAMIENTO  Los tratamientos Emergency planning/management officer. Dependern del tamao de la hernia. Las opciones incluyen:   Observacin cuidadosa. Esto a menudo se sugiere si la hernia es pequea y usted no ha tenido sntomas.  No se realizar ningn procedimiento mdico excepto que aparezcan sntomas.  Tendr que prestar atencin a los sntomas. Si tiene sntomas, comunquese con su mdico de inmediato.  Ciruga. Se realiza si la hernia es grande o si tiene sntomas.  Ciruga abierta. Por lo general, este es un procedimiento ambulatorio (no tendr que pasar la noche en el hospital). Se realiza un corte (incisin) a travs de la piel de la ingle. La hernia se vuelve a colocar en el interior del abdomen.  Luego se repara la zona dbil en los msculos con una herniorrafia o hernioplastia. Herniorrafa: en este tipo de [place], se suturan juntos los msculos dbiles. Hernioplasta: se coloca un parche o malla para cerrar el rea dbil en la pared abdominal.  Laparoscopia. En este procedimiento, el cirujano hace incisiones pequeas. Se coloca en el abdomen  un tubo delgado con una pequea cmara de video (llamado laparoscopio). El cirujano repara la hernia con [place], observando en una cmara de vdeo y utilizando dos instrumentos largos. INSTRUCCIONES PARA EL CUIDADO EN EL HOGAR   Despus de la ciruga de reparacin de una hernia inguinal:  Necesitar tomar un analgsico para el dolor recetado por su mdico. Siga cuidadosamente todas las indicaciones.  Tendr que cuidar la herida de la incisin.  Deber restringir algunas actividades por un tiempo. Incluir no levantar objetos pesados   durante varias semanas. Tampoco podr hacer nada demasiado activo durante algunas semanas. La vuelta al [place] depender del tipo de trabajo que tenga.  Durante perodos de "espera vigilante", usted debe:  Mantenga un peso saludable.  Consumir una dieta rica en fibra (frutas, verduras y granos enteros).  Beba gran cantidad de lquidos para evitar la constipacin. Esto significa beber suficiente agua y otros lquidos para mantener la orina clara o de color amarillo plido.  No levante objetos pesados.  No permanezca de pie durante largos perodos.  Deje de fumar. Evite toser con frecuencia. SOLICITE ATENCIN MDICA SI:   Aparece una protuberancia en el rea de la ingle.  Siente dolor, tiene sensacin de [place] o de presin en la ingle. Esto podra empeorar si levanta pesos o hace esfuerzos.  Tiene fiebre de ms de 100.5 F (38.1 C). SOLICITE ATENCIN MDICA DE INMEDIATO SI:   El dolor en la ingle aumenta repentinamente.  Una protuberancia en la ingle se hace ms grande y no baja.  En los hombres, un dolor repentino en el escroto. O el escroto aumenta de tamao.  Un bulto en el rea de la ingle se vuelve de color rojo o prpura y es dolorosa al tacto.  Tiene nuseas o vmitos que no desaparecen.  Siente que su corazn late mucho ms rpido de lo normal.  No puede mover el intestino o eliminar gases.  Tiene fiebre de ms de 102.0 F  (38.9 C). Document Released: 09/18/2012 [facility] Patient Information 2015 [place]. This information is not intended to replace advice given to you by your health care provider. Make sure you discuss any questions you have with your health care provider.

## 2015-03-03 NOTE — ED Notes (Signed)
Pt  Reports  Pain  r  Side     X  4  Days     denys  Any  Injury        Pt   Reports   Nausea  No vomiting                  Pt  Reports        The    Pain is  On a  Scale  Of  10          Pt is  Sitting  Upright on the  Exam table  Speaking in  Complete  sentances    Advised    Npo

## 2015-03-17 ENCOUNTER — Encounter: Payer: Self-pay | Admitting: Family Medicine

## 2015-03-17 ENCOUNTER — Ambulatory Visit (INDEPENDENT_AMBULATORY_CARE_PROVIDER_SITE_OTHER): Payer: No Typology Code available for payment source | Admitting: Family Medicine

## 2015-03-17 VITALS — BP 128/72 | HR 91 | Temp 97.9°F | Resp 16 | Ht 66.0 in | Wt 181.0 lb

## 2015-03-17 DIAGNOSIS — R1031 Right lower quadrant pain: Secondary | ICD-10-CM

## 2015-03-17 DIAGNOSIS — Z0000 Encounter for general adult medical examination without abnormal findings: Secondary | ICD-10-CM

## 2015-03-17 DIAGNOSIS — H269 Unspecified cataract: Secondary | ICD-10-CM | POA: Insufficient documentation

## 2015-03-17 DIAGNOSIS — Z23 Encounter for immunization: Secondary | ICD-10-CM

## 2015-03-17 DIAGNOSIS — K4090 Unilateral inguinal hernia, without obstruction or gangrene, not specified as recurrent: Secondary | ICD-10-CM

## 2015-03-17 LAB — CBC WITH DIFFERENTIAL/PLATELET
BASOS ABS: 0.1 10*3/uL (ref 0.0–0.1)
BASOS PCT: 1 % (ref 0–1)
EOS ABS: 0.4 10*3/uL (ref 0.0–0.7)
Eosinophils Relative: 4 % (ref 0–5)
HCT: 40.3 % (ref 39.0–52.0)
HEMOGLOBIN: 14.4 g/dL (ref 13.0–17.0)
Lymphocytes Relative: 34 % (ref 12–46)
Lymphs Abs: 3.4 10*3/uL (ref 0.7–4.0)
MCH: 32.9 pg (ref 26.0–34.0)
MCHC: 35.7 g/dL (ref 30.0–36.0)
MCV: 92 fL (ref 78.0–100.0)
MPV: 9.8 fL (ref 8.6–12.4)
Monocytes Absolute: 0.7 10*3/uL (ref 0.1–1.0)
Monocytes Relative: 7 % (ref 3–12)
NEUTROS ABS: 5.4 10*3/uL (ref 1.7–7.7)
NEUTROS PCT: 54 % (ref 43–77)
PLATELETS: 405 10*3/uL — AB (ref 150–400)
RBC: 4.38 MIL/uL (ref 4.22–5.81)
RDW: 13 % (ref 11.5–15.5)
WBC: 10 10*3/uL (ref 4.0–10.5)

## 2015-03-17 LAB — HEMOGLOBIN A1C
Hgb A1c MFr Bld: 6.1 % — ABNORMAL HIGH (ref <5.7)
Mean Plasma Glucose: 128 mg/dL — ABNORMAL HIGH (ref <117)

## 2015-03-17 LAB — COMPLETE METABOLIC PANEL WITH GFR
ALBUMIN: 4.2 g/dL (ref 3.6–5.1)
ALK PHOS: 101 U/L (ref 40–115)
ALT: 32 U/L (ref 9–46)
AST: 19 U/L (ref 10–35)
BILIRUBIN TOTAL: 0.5 mg/dL (ref 0.2–1.2)
BUN: 14 mg/dL (ref 7–25)
CO2: 23 mmol/L (ref 20–31)
Calcium: 9 mg/dL (ref 8.6–10.3)
Chloride: 107 mmol/L (ref 98–110)
Creat: 1.03 mg/dL (ref 0.70–1.33)
GFR, EST NON AFRICAN AMERICAN: 81 mL/min (ref >=60)
GFR, Est African American: >89 mL/min (ref >=60)
GLUCOSE: 103 mg/dL — AB (ref 65–99)
Potassium: 3.9 mmol/L (ref 3.5–5.3)
SODIUM: 140 mmol/L (ref 135–146)
TOTAL PROTEIN: 6.6 g/dL (ref 6.1–8.1)

## 2015-03-17 MED ORDER — KETOROLAC TROMETHAMINE 60 MG/2ML IM SOLN
60.0000 mg | Freq: Once | INTRAMUSCULAR | Status: AC
  Administered 2015-03-17: 60 mg via INTRAMUSCULAR

## 2015-03-17 MED ORDER — TRAMADOL HCL 50 MG PO TABS: 50.0000 mg | ORAL_TABLET | Freq: Four times a day (QID) | ORAL | Status: DC | PRN

## 2015-03-17 NOTE — Patient Instructions (Signed)
Hernia, Adult A hernia is the bulging of an organ or tissue through a weak spot in the muscles of the abdomen (abdominal wall). Hernias develop most often near the navel or groin. There are many kinds of hernias. Common kinds include:  Femoral hernia. This kind of hernia develops under the groin in the upper thigh area.  Inguinal hernia. This kind of hernia develops in the groin or scrotum.  Umbilical hernia. This kind of hernia develops near the navel.  Hiatal hernia. This kind of hernia causes part of the stomach to be pushed up into the chest.  Incisional hernia. This kind of hernia bulges through a scar from an abdominal surgery. CAUSES This condition may be caused by:  Heavy lifting.  Coughing over a long period of time.  Straining to have a bowel movement.  An incision made during an abdominal surgery.  A birth defect (congenital defect).  Excess weight or obesity.  Smoking.  Poor nutrition.  Cystic fibrosis.  Excess fluid in the abdomen.  Undescended testicles. SYMPTOMS Symptoms of a hernia include:  A lump on the abdomen. This is the first sign of a hernia. The lump may become more obvious with standing, straining, or coughing. It may get bigger over time if it is not treated or if the condition causing it is not treated.  Pain. A hernia is usually painless, but it may become painful over time if treatment is delayed. The pain is usually dull and may get worse with standing or lifting heavy objects. Sometimes a hernia gets tightly squeezed in the weak spot (strangulated) or stuck there (incarcerated) and causes additional symptoms. These symptoms may include:  Vomiting.  Nausea.  Constipation.  Irritability. DIAGNOSIS A hernia may be diagnosed with:  A physical exam. During the exam your health care provider may ask you to cough or to make a specific movement, because a hernia is usually more visible when you move.  Imaging tests. These can  include:  X-rays.  Ultrasound.  CT scan. TREATMENT A hernia that is small and painless may not need to be treated. A hernia that is large or painful may be treated with surgery. Inguinal hernias may be treated with surgery to prevent incarceration or strangulation. Strangulated hernias are always treated with surgery, because lack of blood to the trapped organ or tissue can cause it to die. Surgery to treat a hernia involves pushing the bulge back into place and repairing the weak part of the abdomen. HOME CARE INSTRUCTIONS  Avoid straining.  Do not lift anything heavier than 10 lb (4.5 kg).  Lift with your leg muscles, not your back muscles. This helps avoid strain.  When coughing, try to cough gently.  Prevent constipation. Constipation leads to straining with bowel movements, which can make a hernia worse or cause a hernia repair to break down. You can prevent constipation by:  Eating a high-fiber diet that includes plenty of fruits and vegetables.  Drinking enough fluids to keep your urine clear or pale yellow. Aim to drink 6-8 glasses of water per day.  Using a stool softener as directed by your health care provider.  Lose weight, if you are overweight.  Do not use any tobacco products, including cigarettes, chewing tobacco, or electronic cigarettes. If you need help quitting, ask your health care provider.  Keep all follow-up visits as directed by your health care provider. This is important. Your health care provider may need to monitor your condition. SEEK MEDICAL CARE IF:  You have   swelling, redness, and pain in the affected area.  Your bowel habits change. SEEK IMMEDIATE MEDICAL CARE IF:  You have a fever.  You have abdominal pain that is getting worse.  You feel nauseous or you vomit.  You cannot push the hernia back in place by gently pressing on it while you are lying down.  The hernia:  Changes in shape or size.  Is stuck outside the  abdomen.  Becomes discolored.  Feels hard or tender.   This information is not intended to replace advice given to you by your health care provider. Make sure you discuss any questions you have with your health care provider.   Document Released: 05/24/2005 Document Revised: 06/14/2014 Document Reviewed: 04/03/2014 Elsevier Interactive Patient Education 2016 Elsevier Inc.  

## 2015-03-17 NOTE — Progress Notes (Signed)
Subjective:    Patient ID: Dustin Perry, male    DOB: 1960-06-18, 54 y.o.   MRN: [id number]  HPI [name], a 54 year old male presents to establish care. He reports that he has primarily been utilizing urgent care or the emergency department for healthcare needs. [name] is complaining of an inguinal hernia with increased pain to right groin over the past several months. He was evaluated in the emergency department on 03/03/2015. He states that swelling and pain has worsened in right groin. He states that groin pain and swelling is aggravated by lifting, bending, coughing, or having bowel movements. He states that symptoms are minimally improved by rest and ice pack to area. Pain intensity is currently 6/10. He has not attempted any OTC interventions to assist with pain. 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 He states that he has not had any other health issues.   Past Medical History  Diagnosis Date  . History of nephrectomy     left, for donation   Social History   Social History  . Marital Status: Married    Spouse Name: N/A  . Number of Children: N/A  . Years of Education: N/A   Occupational History  . Not on file.   Social History Main Topics  . Smoking status: Former Research scientist (life sciences)  . Smokeless tobacco: Never Used  . Alcohol Use: Yes     Comment: occ  . Drug Use: Yes    Special: Marijuana  . Sexual Activity: Not on file   Other Topics Concern  . Not on file   Social History Narrative  No Known Allergies  Immunization History  Administered Date(s) Administered  . Influenza,inj,Quad PF,36+ Mos 03/17/2015   Review of Systems  Constitutional: Negative.  Negative for fever and fatigue.  HENT: Negative.   Eyes: Positive for visual disturbance (history of cataract).  Respiratory: Negative.   Cardiovascular: Negative.   Gastrointestinal: Positive for abdominal pain.       Right inguinal hernia    Endocrine: Negative.   Genitourinary: Negative.   Musculoskeletal: Negative.   Skin: Negative.   Neurological: Negative.   Hematological: Negative.   Psychiatric/Behavioral: Negative.        Objective:   Physical Exam  Constitutional: He is oriented to person, place, and time. He appears well-developed and well-nourished.  HENT:  Head: Normocephalic.  Right Ear: External ear normal.  Mouth/Throat: Oropharynx is clear and moist.  Eyes: Conjunctivae and EOM are normal. Pupils are equal, round, and reactive to light.  Fundoscopic exam:      The right eye shows red reflex.       The left eye shows red reflex.    Neck: Normal range of motion. Neck supple.  Cardiovascular: Normal rate, regular rhythm, normal heart sounds and intact distal pulses.   Pulmonary/Chest: Effort normal and breath sounds normal.  Abdominal: Soft. Bowel sounds are normal. A hernia is present. Hernia confirmed positive in the right inguinal area.  Genitourinary:    Uncircumcised.  Musculoskeletal: Normal range of motion.  Neurological: He is alert and oriented to person, place, and time. He has normal reflexes.  Skin: Skin is warm and dry.  Psychiatric: He has a normal mood and affect. His behavior is normal. Judgment and thought content normal.         BP 128/72 mmHg  Pulse 91  Temp(Src) 97.9 F (36.6 C) (Oral)  Resp 16  Ht 5\' 6"  (  1.676 m)  Wt 181 lb (82.101 kg)  BMI 29.23 kg/m2 Assessment & Plan:  1. Right inguinal hernia - Ambulatory referral to General Surgery - traMADol (ULTRAM) 50 MG tablet; Take 1 tablet (50 mg total) by mouth every 6 (six) hours as needed.  Dispense: 30 tablet; Refill: 0  2. Right cataract Patient reports that he has had cataract for greater than 1 year. Patient has been unable to seen opthalmologist due to financial constraints.  Will send a referral to opthalmology  3. Groin pain, right  - ketorolac (TORADOL) injection 60 mg; Inject 2 mLs (60 mg total) into the  muscle once. - traMADol (ULTRAM) 50 MG tablet; Take 1 tablet (50 mg total) by mouth every 6 (six) hours as needed.  Dispense: 30 tablet; Refill: 0  4. Health care maintenance Will schedule follow-up for prostate exam. Recommend colonoscopy Recommend a lowfat, low carbohydrate diet, increase water intake to 6-8 glasses Will return to clinic for tetanus vaccination - CBC with Differential - COMPLETE METABOLIC PANEL WITH GFR - Hemoglobin A1c - TSH - POCT urinalysis dipstick  5. Need for immunization against influenza  - Flu Vaccine QUAD 36+ mos IM (Fluarix)

## 2015-03-18 ENCOUNTER — Telehealth: Payer: Self-pay | Admitting: Family Medicine

## 2015-03-18 DIAGNOSIS — R7303 Prediabetes: Secondary | ICD-10-CM | POA: Insufficient documentation

## 2015-03-18 LAB — TSH: TSH: 1.924 u[IU]/mL (ref 0.350–4.500)

## 2015-03-18 NOTE — Telephone Encounter (Signed)
Reviewed labs, hemoglobin a1C is 6.1, goal is <5.7%. Recommend a lowfat, low carbohydrate diet divided over 5-6 small meals, increase water intake to 6-8 glasses, and 150 minutes per week of cardiovascular exercise.  Will follow up in 6 months.    Dorena Dew, FNP

## 2015-03-18 NOTE — Telephone Encounter (Signed)
Called and spoke with patient regarding labs, explained [place]'s directions for diet and exercise. Patient verbalized understanding. Thanks!

## 2015-04-04 ENCOUNTER — Telehealth: Payer: Self-pay

## 2015-04-04 NOTE — Telephone Encounter (Signed)
[facility] called saying patient NO SHOWED for appointment for hernia. Thanks!

## 2015-04-07 ENCOUNTER — Ambulatory Visit (INDEPENDENT_AMBULATORY_CARE_PROVIDER_SITE_OTHER): Payer: No Typology Code available for payment source | Admitting: Family Medicine

## 2015-04-07 ENCOUNTER — Encounter: Payer: Self-pay | Admitting: Family Medicine

## 2015-04-07 VITALS — BP 103/74 | HR 85 | Temp 98.0°F | Resp 18 | Wt 178.0 lb

## 2015-04-07 DIAGNOSIS — R1031 Right lower quadrant pain: Secondary | ICD-10-CM

## 2015-04-07 DIAGNOSIS — R1030 Lower abdominal pain, unspecified: Secondary | ICD-10-CM | POA: Insufficient documentation

## 2015-04-07 DIAGNOSIS — R82998 Other abnormal findings in urine: Secondary | ICD-10-CM

## 2015-04-07 DIAGNOSIS — K4090 Unilateral inguinal hernia, without obstruction or gangrene, not specified as recurrent: Secondary | ICD-10-CM

## 2015-04-07 DIAGNOSIS — N390 Urinary tract infection, site not specified: Secondary | ICD-10-CM

## 2015-04-07 LAB — POCT URINALYSIS DIP (DEVICE)
BILIRUBIN URINE: NEGATIVE
Glucose, UA: NEGATIVE mg/dL
Ketones, ur: NEGATIVE mg/dL
NITRITE: NEGATIVE
Protein, ur: NEGATIVE mg/dL
Specific Gravity, Urine: >=1.03 (ref 1.005–1.030)
UROBILINOGEN UA: 0.2 mg/dL (ref 0.0–1.0)
pH: 5 (ref 5.0–8.0)

## 2015-04-07 MED ORDER — TRAMADOL HCL 50 MG PO TABS: 50.0000 mg | ORAL_TABLET | Freq: Four times a day (QID) | ORAL | Status: DC | PRN

## 2015-04-07 NOTE — Progress Notes (Signed)
Subjective:    Patient ID: Dustin Perry, male    DOB: 03/25/1961, 54 y.o.   MRN: [id number]  HPI [name], a 54 year old male presents for right inguinal hernia pain. . [name] is complaining of an inguinal hernia with increased pain to right groin over the past several months.  He states that swelling and pain has worsened in right groin. He states that groin pain and swelling is aggravated by lifting, bending, coughing, or having bowel movements. He states that symptoms are minimally improved by rest and ice pack to area. Pain intensity is currently 10/10. He states that he last had Toradol last night around 9 pm, but is currently out of medication. Patient was referred for a general surgery evaluation, but he cannot be scheduled until January 2017.   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   Past Medical History  Diagnosis Date  . History of nephrectomy     left, for donation   Social History   Social History  . Marital Status: Married    Spouse Name: N/A  . Number of Children: N/A  . Years of Education: N/A   Occupational History  . Not on file.   Social History Main Topics  . Smoking status: Former Research scientist (life sciences)  . Smokeless tobacco: Never Used  . Alcohol Use: Yes     Comment: occ  . Drug Use: Yes    Special: Marijuana  . Sexual Activity: Not on file   Other Topics Concern  . Not on file   Social History Narrative  No Known Allergies  Immunization History  Administered Date(s) Administered  . Influenza,inj,Quad PF,36+ Mos 03/17/2015   Review of Systems  Constitutional: Negative.  Negative for fever and fatigue.  HENT: Negative.   Eyes: Positive for visual disturbance (history of cataract).  Respiratory: Negative.   Cardiovascular: Negative.   Gastrointestinal: Positive for abdominal pain.       Right inguinal hernia/right groin pain  Endocrine: Negative.   Genitourinary: Negative.  Negative for  penile pain.  Musculoskeletal: Negative.   Skin: Negative.   Neurological: Negative.   Hematological: Negative.   Psychiatric/Behavioral: Negative.        Objective:   Physical Exam  Constitutional: He is oriented to person, place, and time. He appears well-developed and well-nourished.  HENT:  Head: Normocephalic.  Right Ear: External ear normal.  Mouth/Throat: Oropharynx is clear and moist.  Eyes: Conjunctivae and EOM are normal. Pupils are equal, round, and reactive to light.  Fundoscopic exam:      The right eye shows red reflex.       The left eye shows red reflex.  Right cateract  Neck: Normal range of motion. Neck supple.  Cardiovascular: Normal rate, regular rhythm, normal heart sounds and intact distal pulses.   Pulmonary/Chest: Effort normal and breath sounds normal.  Abdominal: Soft. Bowel sounds are normal. A hernia is present. Hernia confirmed positive in the right inguinal area.  Genitourinary:    Uncircumcised.  Musculoskeletal: Normal range of motion.  Neurological: He is alert and oriented to person, place, and time. He has normal reflexes.  Skin: Skin is warm and dry.  Psychiatric: He has a normal mood and affect. His behavior is normal. Judgment and thought content normal.      BP 103/74 mmHg  Pulse 85  Temp(Src) 98 F (36.7 C) (Oral)  Resp 18  Wt 178 lb (80.74 kg)  SpO2 99%  Assessment & Plan:  1. Right inguinal hernia Patient is scheduled to have surgery in January. He states that pain is worsening with activity. Patient states that he lifts heavy objects while working. Reminded patient to refrain from heavy lifting.  Recommend that patient report to the emergency department for increased pain and trouble urinating. Increase liquids to prevent constipation.  - traMADol (ULTRAM) 50 MG tablet; Take 1 tablet (50 mg total) by mouth every 6 (six) hours as needed.  Dispense: 60 tablet; Refill: 0 - POCT urinalysis dipstick  2. Groin pain, right - traMADol  (ULTRAM) 50 MG tablet; Take 1 tablet (50 mg total) by mouth every 6 (six) hours as needed.  Dispense: 60 tablet; Refill: 0 - POCT urinalysis dipstick  3. Urine leukocytes  - Urine culture  RTC: as needed Dorena Dew, FNP

## 2015-04-07 NOTE — Patient Instructions (Addendum)
Recommend that patient report to the emergency room for increased pain. Inguinal Hernia, Adult Muscles help keep everything in the body in its proper place. But if a weak spot in the muscles develops, something can poke through. That is called a hernia. When this happens in the lower part of the belly (abdomen), it is called an inguinal hernia. (It takes its name from a part of the body in this region called the inguinal canal.) A weak spot in the wall of muscles lets some fat or part of the small intestine bulge through. An inguinal hernia can develop at any age. Men get them more often than women. CAUSES  In adults, an inguinal hernia develops over time.  It can be triggered by:  Suddenly straining the muscles of the lower abdomen.  Lifting heavy objects.  Straining to have a bowel movement. Difficult bowel movements (constipation) can lead to this.  Constant coughing. This may be caused by smoking or lung disease.  Being overweight.  Being pregnant.  Working at a job that requires long periods of standing or heavy lifting.  Having had an inguinal hernia before. One type can be an emergency situation. It is called a strangulated inguinal hernia. It develops if part of the small intestine slips through the weak spot and cannot get back into the abdomen. The blood supply can be cut off. If that happens, part of the intestine may die. This situation requires emergency surgery. SYMPTOMS  Often, a small inguinal hernia has no symptoms. It is found when a healthcare provider does a physical exam. Larger hernias usually have symptoms.   In adults, symptoms may include:  A lump in the groin. This is easier to see when the person is standing. It might disappear when lying down.  In men, a lump in the scrotum.  Pain or burning in the groin. This occurs especially when lifting, straining or coughing.  A dull ache or feeling of pressure in the groin.  Signs of a strangulated hernia can  include:  A bulge in the groin that becomes very painful and tender to the touch.  A bulge that turns red or purple.  Fever, nausea and vomiting.  Inability to have a bowel movement or to pass gas. DIAGNOSIS  To decide if you have an inguinal hernia, a healthcare provider will probably do a physical examination.  This will include asking questions about any symptoms you have noticed.  The healthcare provider might feel the groin area and ask you to cough. If an inguinal hernia is felt, the healthcare provider may try to slide it back into the abdomen.  Usually no other tests are needed. TREATMENT  Treatments can vary. The size of the hernia makes a difference. Options include:  Watchful waiting. This is often suggested if the hernia is small and you have had no symptoms.  No medical procedure will be done unless symptoms develop.  You will need to watch closely for symptoms. If any occur, contact your healthcare provider right away.  Surgery. This is used if the hernia is larger or you have symptoms.  Open surgery. This is usually an outpatient procedure (you will not stay overnight in a hospital). An cut (incision) is made through the skin in the groin. The hernia is put back inside the abdomen. The weak area in the muscles is then repaired by herniorrhaphy or hernioplasty. Herniorrhaphy: in this type of surgery, the weak muscles are sewn back together. Hernioplasty: a patch or mesh is used  to close the weak area in the abdominal wall.  Laparoscopy. In this procedure, a surgeon makes small incisions. A thin tube with a tiny video camera (called a laparoscope) is put into the abdomen. The surgeon repairs the hernia with mesh by looking with the video camera and using two long instruments. HOME CARE INSTRUCTIONS   After surgery to repair an inguinal hernia:  You will need to take pain medicine prescribed by your healthcare provider. Follow all directions carefully.  You will need  to take care of the wound from the incision.  Your activity will be restricted for awhile. This will probably include no heavy lifting for several weeks. You also should not do anything too active for a few weeks. When you can return to work will depend on the type of job that you have.  During "watchful waiting" periods, you should:  Maintain a healthy weight.  Eat a diet high in fiber (fruits, vegetables and whole grains).  Drink plenty of fluids to avoid constipation. This means drinking enough water and other liquids to keep your urine clear or pale yellow.  Do not lift heavy objects.  Do not stand for long periods of time.  Quit smoking. This should keep you from developing a frequent cough. SEEK MEDICAL CARE IF:   A bulge develops in your groin area.  You feel pain, a burning sensation or pressure in the groin. This might be worse if you are lifting or straining.  You develop a fever of more than 100.5 F (38.1 C). SEEK IMMEDIATE MEDICAL CARE IF:   Pain in the groin increases suddenly.  A bulge in the groin gets bigger suddenly and does not go down.  For men, there is sudden pain in the scrotum. Or, the size of the scrotum increases.  A bulge in the groin area becomes red or purple and is painful to touch.  You have nausea or vomiting that does not go away.  You feel your heart beating much faster than normal.  You cannot have a bowel movement or pass gas.  You develop a fever of more than 102.0 F (38.9 C).   This information is not intended to replace advice given to you by your health care provider. Make sure you discuss any questions you have with your health care provider.   Document Released: 10/10/2008 Document Revised: 08/16/2011 Document Reviewed: 11/25/2014 Elsevier Interactive Patient Education [employer].

## 2015-04-09 LAB — URINE CULTURE
COLONY COUNT: NO GROWTH
ORGANISM ID, BACTERIA: NO GROWTH

## 2015-05-12 ENCOUNTER — Telehealth: Payer: Self-pay | Admitting: Family Medicine

## 2015-05-12 DIAGNOSIS — K4090 Unilateral inguinal hernia, without obstruction or gangrene, not specified as recurrent: Secondary | ICD-10-CM

## 2015-05-12 DIAGNOSIS — R1031 Right lower quadrant pain: Secondary | ICD-10-CM

## 2015-05-12 MED ORDER — TRAMADOL HCL 50 MG PO TABS: 50.0000 mg | ORAL_TABLET | Freq: Four times a day (QID) | ORAL | Status: DC | PRN

## 2015-05-12 NOTE — Telephone Encounter (Signed)
Refill request for tramadol 50mg . LOV 04/07/2015. Please advise. Thanks!

## 2015-05-12 NOTE — Telephone Encounter (Signed)
Reviewed [place] Substance Reporting system prior to prescribing opiate medications, no inconsistencies noted.   Meds ordered this encounter  Medications  . traMADol (ULTRAM) 50 MG tablet    Sig: Take 1 tablet (50 mg total) by mouth every 6 (six) hours as needed.    Dispense:  60 tablet    Refill:  0    Order Specific Question:  Supervising Provider    Answer:  Tresa Garter [id number]    Dorena Dew, FNP

## 2015-06-13 ENCOUNTER — Telehealth: Payer: Self-pay

## 2015-06-13 DIAGNOSIS — R1031 Right lower quadrant pain: Secondary | ICD-10-CM

## 2015-06-13 DIAGNOSIS — K4090 Unilateral inguinal hernia, without obstruction or gangrene, not specified as recurrent: Secondary | ICD-10-CM

## 2015-06-13 MED ORDER — TRAMADOL HCL 50 MG PO TABS: 50.0000 mg | ORAL_TABLET | Freq: Four times a day (QID) | ORAL | Status: DC | PRN

## 2015-06-13 NOTE — Telephone Encounter (Signed)
[place], Patient came in today asking for a refill of Tramadol 50mg . He has been unable to be seen at [facility] for the Hernia due to [facility] card not having any openings and patient unable to pay cash. Please advise. Patient ask that we sent rx into [facility]. Thanks!

## 2015-06-13 NOTE — Telephone Encounter (Signed)
We have scheduled several appointments for Dustin Perry for an inguinal hernia. He has not followed up due to lack of payer source. Informed patient that we do not treat chronic pain. I will give an Rx for Tramadol today, but he will have to follow up with general surgery as scheduled. Reviewed [place] Substance Reporting system prior to prescribing opiate medications, no inconsistencies noted.    Meds ordered this encounter  Medications  . traMADol (ULTRAM) 50 MG tablet    Sig: Take 1 tablet (50 mg total) by mouth every 6 (six) hours as needed.    Dispense:  60 tablet    Refill:  0    Order Specific Question:  Supervising Provider    Answer:  Tresa Garter [id number]    Dorena Dew, FNP

## 2015-07-07 ENCOUNTER — Telehealth: Payer: Self-pay | Admitting: Family Medicine

## 2015-07-07 DIAGNOSIS — K4090 Unilateral inguinal hernia, without obstruction or gangrene, not specified as recurrent: Secondary | ICD-10-CM

## 2015-07-07 DIAGNOSIS — R1031 Right lower quadrant pain: Secondary | ICD-10-CM

## 2015-07-07 MED ORDER — TRAMADOL HCL 50 MG PO TABS: 50.0000 mg | ORAL_TABLET | Freq: Four times a day (QID) | ORAL | Status: DC | PRN

## 2015-07-07 NOTE — Telephone Encounter (Signed)
Refill request for tramadol 50mg . Patient states that his surgery is in 2 weeks. Please advise. LOV 04/07/2015

## 2015-07-07 NOTE — Telephone Encounter (Signed)
Reviewed [place] Substance Reporting system prior to prescribing opiate medications, no inconsistencies noted. Patient will have surgery in 2 weeks for inguinal hernia. Will no longer prescribe pain medications.   Meds ordered this encounter  Medications  . traMADol (ULTRAM) 50 MG tablet    Sig: Take 1 tablet (50 mg total) by mouth every 6 (six) hours as needed.    Dispense:  60 tablet    Refill:  0    Order Specific Question:  Supervising Provider    Answer:  Tresa Garter [id number]    Dorena Dew, FNP

## 2015-07-07 NOTE — Telephone Encounter (Signed)
Patient is scheduled to have surgery in two weeks.

## 2015-07-07 NOTE — Addendum Note (Signed)
Addended by: Dorena Dew on: 07/07/2015 12:12 PM   Modules accepted: Orders

## 2015-07-22 ENCOUNTER — Emergency Department (HOSPITAL_COMMUNITY)
Admission: EM | Admit: 2015-07-22 | Discharge: 2015-07-22 | Disposition: A | Discharge status: Home / Self Care | Payer: No Typology Code available for payment source | Attending: Emergency Medicine | Admitting: Emergency Medicine

## 2015-07-22 ENCOUNTER — Telehealth: Payer: Self-pay | Admitting: Internal Medicine

## 2015-07-22 ENCOUNTER — Encounter (HOSPITAL_COMMUNITY): Payer: Self-pay

## 2015-07-22 DIAGNOSIS — K4090 Unilateral inguinal hernia, without obstruction or gangrene, not specified as recurrent: Secondary | ICD-10-CM | POA: Insufficient documentation

## 2015-07-22 DIAGNOSIS — Z87891 Personal history of nicotine dependence: Secondary | ICD-10-CM | POA: Insufficient documentation

## 2015-07-22 DIAGNOSIS — R1031 Right lower quadrant pain: Secondary | ICD-10-CM

## 2015-07-22 DIAGNOSIS — Z9889 Other specified postprocedural states: Secondary | ICD-10-CM | POA: Insufficient documentation

## 2015-07-22 DIAGNOSIS — K4091 Unilateral inguinal hernia, without obstruction or gangrene, recurrent: Secondary | ICD-10-CM

## 2015-07-22 MED ORDER — HYDROCODONE-ACETAMINOPHEN 5-325 MG PO TABS: 1.0000 | ORAL_TABLET | Freq: Four times a day (QID) | ORAL | Status: DC | PRN

## 2015-07-22 MED FILL — HYDROCODON-APAP 5-325: 5-325 | 3 days supply | Qty: 11 | Fill #0

## 2015-07-22 NOTE — ED Notes (Signed)
Patient here with ongoing right groin pain and swelling since October, today unable to push back in, reports pain is severe

## 2015-07-22 NOTE — ED Notes (Signed)
MD at bedside. States she reduced inguinal hernia.

## 2015-07-22 NOTE — Care Management Note (Signed)
Case Management Note  Patient Details  Name: Dustin Perry MRN: [id number] Date of Birth: 03/14/1961  Subjective/Objective:                  55 y.o. male with a hx of inguinal hernia presents to the Emergency Department complaining of gradual, persistent, progressively worsening pain. //Home with family.  Action/Plan: Follow for disposition needs; set up surgery with [facility].   Expected Discharge Date:       07/22/15           Expected Discharge Plan:  Home/Self Care  In-House Referral:  NA  Discharge planning Services  CM Consult, [facility] / [facility] (established/new)  Post Acute Care Choice:  NA Choice offered to:  NA  DME Arranged:  N/A DME Agency:  NA  HH Arranged:  NA HH Agency:  NA  Status of Service:  Completed, signed off  Medicare Important Message Given:    Date Medicare IM Given:    Medicare IM give by:    Date Additional Medicare IM Given:    Additional Medicare Important Message give by:     If discussed at [place] of Stay Meetings, dates discussed:    Additional Comments: ERCM consulted to inquire about process for setting up surgery for pt with orange card at [facility] .  ERCM called [facility] office to find that referrals come from [facility] [phone]).  ERCM spoke with Marzetta Board to find that Pt PCP Cammie Sickle, NP) should fax referral to her at [facility] [phone]).  ERCM called [facility] to give them fax number and contact for setting up surgery for orange card pt; once received, Marzetta Board will forward to [facility] and [facility] will contact pt for surgery time. Fuller Mandril, RN 07/22/2015, 2:47 PM

## 2015-07-22 NOTE — Discharge Instructions (Signed)
1. Medications: Vicodin only for severe pain, usual home medications 2. Treatment: rest, drink plenty of fluids, when your hernia feels out please lie flat on the floor and attempt to reduce it 3. Follow Up: Your primary care physician will set up an appointment with general surgery. If you have not heard from them by Thursday please call your primary care office   Hernia inguinal - Adultos  (Inguinal Hernia, Adult)  Los msculos mantienen todos los rganos del cuerpo en Geneticist, molecular. Pero si se produce un punto dbil [employer], algunos pueden protruir. Eso se llama hernia. Cuando esto sucede en la parte inferior del vientre (abdomen), se trata de una hernia inguinal. (Toma su nombre de una parte del cuerpo que en esta regin se llamada canal inguinal). Un punto dbil en la pared de los msculos deja que un poco de grasa o parte del intestino delgado salgan hacia afuera. Una hernia inguinal puede desarrollarse a cualquier edad. Los hombres la sufren con ms frecuencia que las mujeres.  CAUSAS  En los adultos, la hernia inguinal desarrolla con el tiempo.   Las causas pueden ser:  Un esfuerzo sbito de los msculos de la parte inferior del abdomen.  Levantar objetos pesados.  Dificultad para mover el intestino. La dificultad para mover el intestino (constipacin) puede llevar a una hernia.  Tos constante. La causa puede ser el tabaquismo o una enfermedad pulmonar.  Tener sobrepeso.  El [place].  Tener un empleo que requiera Equities trader perodos de pie o levantar objetos pesados.  Haber sufrido de una hernia inguinal anteriormente. En algunos casos puede convertirse en una situacin de Freight forwarder. Cuando esto ocurre, se llama hernia inguinal estrangulada. Se produce cuando una parte del intestino delgado se desliza a travs del punto dbil y no puede volver al abdomen. El flujo de [place] puede interrumpirse. Si esto ocurre, una parte del intestino puede morir. Esta  situacin requiere [place].  SNTOMAS  Generalmente una hernia inguinal pequea no tiene sntomas. Se diagnostica cuando un profesional de la salud hace un examen fsico. Las hernias ms grandes generalmente presentan sntomas.   En los adultos, los sntomas incluyen:  Un bulto en la ingle. Es fcil de Hydrographic surveyor cuando la persona est de pie. Puede desaparecer al Pershing Proud.  Los hombres pueden tener un bulto Geographical information systems officer.  Dolor o ardor en la ingle. Esto ocurre especialmente al levantar objetos, realizar un esfuerzo o toser.  Dolor sordo o sensacin de presin en la ingle.  Los signos de una hernia estrangulada pueden ser:  Ardelia Mems protuberancia en la ingle que duele mucho y est sensible al tacto.  Un bulto que se vuelve de color rojo o prpura.  Cristy Hilts, nuseas y vmitos.  Imposibilidad de evacuar el intestino o de eliminar gases. DIAGNSTICO  Para diagnosticar una hernia inguinal, el profesional le har un examen fsico.   Incluir preguntas acerca de los sntomas que haya notado.  El mdico palpar el rea de la ingle y le pedir que tosa. Si palpa una hernia inguinal, el mdico podr tratar de deslizarla de nuevo hacia adentro el abdomen.  Por lo general no se necesitan otros estudios. TRATAMIENTO  Los tratamientos Emergency planning/management officer. Dependern del tamao de la hernia. Las opciones incluyen:   Observacin cuidadosa. Esto a menudo se sugiere si la hernia es pequea y usted no ha tenido sntomas.  No se realizar ningn procedimiento mdico excepto que aparezcan sntomas.  Tendr que prestar atencin a los sntomas. Si  tiene sntomas, comunquese con su mdico de inmediato.  Ciruga. Se realiza si la hernia es grande o si tiene sntomas.  Ciruga abierta. Por lo general, este es un procedimiento ambulatorio (no tendr que pasar la noche en el hospital). Se realiza un corte (incisin) a travs de la piel de la ingle. La hernia se vuelve a colocar en el  interior del abdomen. Luego se repara la zona dbil en los msculos con una herniorrafia o hernioplastia. Herniorrafa: en este tipo de [place], se suturan juntos los msculos dbiles. Hernioplasta: se coloca un parche o malla para cerrar el rea dbil en la pared abdominal.  Laparoscopia. En este procedimiento, el cirujano hace incisiones pequeas. Se coloca en el abdomen un tubo delgado con una pequea cmara de video (llamado laparoscopio). El cirujano repara la hernia con [place], observando en una cmara de vdeo y utilizando dos instrumentos largos. INSTRUCCIONES PARA EL CUIDADO EN EL HOGAR   Despus de la ciruga de reparacin de una hernia inguinal:  Necesitar tomar un analgsico para el dolor recetado por su mdico. Siga cuidadosamente todas las indicaciones.  Tendr que cuidar la herida de la incisin.  Deber restringir algunas actividades por un tiempo. Incluir no levantar objetos pesados durante varias semanas. Tampoco podr hacer nada demasiado activo durante algunas semanas. La vuelta al [place] depender del tipo de trabajo que tenga.  Durante perodos de "espera vigilante", usted debe:  Mantenga un peso saludable.  Consumir una dieta rica en fibra (frutas, verduras y granos enteros).  Beba gran cantidad de lquidos para evitar la constipacin. Esto significa beber suficiente agua y otros lquidos para mantener la orina clara o de color amarillo plido.  No levante objetos pesados.  No permanezca de pie durante largos perodos.  Deje de fumar. Evite toser con frecuencia. SOLICITE ATENCIN MDICA SI:   Aparece una protuberancia en el rea de la ingle.  Siente dolor, tiene sensacin de [place] o de presin en la ingle. Esto podra empeorar si levanta pesos o hace esfuerzos.  Tiene fiebre de ms de 100.5 F (38.1 C). SOLICITE ATENCIN MDICA DE INMEDIATO SI:   El dolor en la ingle aumenta repentinamente.  Una protuberancia en la ingle se hace ms grande y no  baja.  En los hombres, un dolor repentino en el escroto. O el escroto aumenta de tamao.  Un bulto en el rea de la ingle se vuelve de color rojo o prpura y es dolorosa al tacto.  Tiene nuseas o vmitos que no desaparecen.  Siente que su corazn late mucho ms rpido de lo normal.  No puede mover el intestino o eliminar gases.  Tiene fiebre de ms de 102.0 F (38.9 C).   Esta informacin no tiene Marine scientist el consejo del mdico. Asegrese de hacerle al mdico cualquier pregunta que tenga.   Document Released: 09/18/2012 Elsevier Interactive Patient Education [employer].

## 2015-07-22 NOTE — Telephone Encounter (Signed)
This has been faxed to [facility] today 07/22/2015 @ 2:53pm. Thanks!

## 2015-07-22 NOTE — ED Provider Notes (Signed)
CSN: [id number]     Arrival date & time 07/22/15  1145 History   First MD Initiated Contact with Patient 07/22/15 1240     Chief Complaint  Patient presents with  . groin swelling      (Consider location/radiation/quality/duration/timing/severity/associated sxs/prior Treatment) The history is provided by the patient and medical records. No language interpreter was used.     Dustin Perry is a 55 y.o. male  with a hx of nephrectomy presents to the Emergency Department complaining of gradual, persistent, progressively worsening right groin pain onset 4 days ago. Patient reports that he began noticing heaviness in his groin and a hernia beginning October 2016. He reports that prior to 4 days ago he has been able to push it back in. He reports that it has been unreducible for the last 4 days. He's been taking tramadol with minimal relief of his pain. He reports intermittent nausea but no vomiting or diarrhea. No fevers or chills. No dysuria or hematuria.. Nothing makes the symptoms better or worse.  Patient reports that he recently obtained an orange card and an effort to make an appointment with general surgery for repair of his hernia however he reports that they have been unwilling to see him and states it will be several more months.   Past Medical History  Diagnosis Date  . History of nephrectomy     left, for donation   Past Surgical History  Procedure Laterality Date  . Nephrectomy living donor  63  . Surgery scrotal / testicular  2004    fatty mass removed    Family History  Problem Relation Age of Onset  . Asthma Mother    Social History  Substance Use Topics  . Smoking status: Former Research scientist (life sciences)  . Smokeless tobacco: Never Used  . Alcohol Use: Yes     Comment: occ    Review of Systems  Constitutional: Negative for fever, diaphoresis, appetite change, fatigue and unexpected weight change.  HENT: Negative for mouth sores.   Eyes: Negative for visual disturbance.   Respiratory: Negative for cough, chest tightness, shortness of breath and wheezing.   Cardiovascular: Negative for chest pain.  Gastrointestinal: Negative for nausea, vomiting, abdominal pain, diarrhea and constipation.  Endocrine: Negative for polydipsia, polyphagia and polyuria.  Genitourinary: Negative for dysuria, urgency, frequency and hematuria.       Right inguinal hernia  Musculoskeletal: Negative for back pain and neck stiffness.  Skin: Negative for rash.  Allergic/Immunologic: Negative for immunocompromised state.  Neurological: Negative for syncope, light-headedness and headaches.  Hematological: Does not bruise/bleed easily.  Psychiatric/Behavioral: Negative for sleep disturbance. The patient is not nervous/anxious.       Allergies  Review of patient's allergies indicates no known allergies.  Home Medications   Prior to Admission medications   Medication Sig Start Date End Date Taking? Authorizing Provider  HYDROcodone-acetaminophen (NORCO/VICODIN) 5-325 MG tablet Take 1 tablet by mouth every 6 (six) hours as needed for severe pain. 07/22/15   Lliam Hoh, PA-C  traMADol (ULTRAM) 50 MG tablet Take 1 tablet (50 mg total) by mouth every 6 (six) hours as needed. 07/07/15   Dorena Dew, FNP   BP 164/123 mmHg  Pulse 90  Temp(Src) 98.1 F (36.7 C) (Oral)  Resp 19  Ht 5\' 7"  (1.702 m)  Wt 82.555 kg  BMI 28.50 kg/m2  SpO2 99% Physical Exam  Constitutional: He appears well-developed and well-nourished. No distress.  Awake, alert, nontoxic appearance  HENT:  Head: Normocephalic and atraumatic.  Mouth/Throat: Oropharynx is clear and moist. No oropharyngeal exudate.  Eyes: Conjunctivae are normal. No scleral icterus.  Neck: Normal range of motion. Neck supple.  Cardiovascular: Normal rate, regular rhythm and intact distal pulses.   Pulmonary/Chest: Effort normal and breath sounds normal. No respiratory distress. He has no wheezes.  Equal chest expansion   Abdominal: Soft. Bowel sounds are normal. He exhibits no mass. There is no tenderness. There is no rebound and no guarding. A hernia is present. Hernia confirmed positive in the right inguinal area ( Direct). Hernia confirmed negative in the left inguinal area.  Large, well-healed surgical incision from a laparotomy  Genitourinary: Testes normal. Uncircumcised. No phimosis, paraphimosis, hypospadias, penile erythema or penile tenderness. No discharge found.  Musculoskeletal: Normal range of motion. He exhibits no edema.  Lymphadenopathy:       Right: No inguinal adenopathy present.       Left: No inguinal adenopathy present.  Neurological: He is alert.  Speech is clear and goal oriented Moves extremities without ataxia  Skin: Skin is warm and dry. He is not diaphoretic.  Psychiatric: He has a normal mood and affect.  Nursing note and vitals reviewed.   ED Course  Hernia reduction Date/Time: 07/22/2015 1:14 PM Performed by: Abigail Butts Authorized by: Abigail Butts Consent: Verbal consent obtained. Risks and benefits: risks, benefits and alternatives were discussed Consent given by: patient Patient understanding: patient states understanding of the procedure being performed Patient consent: the patient's understanding of the procedure matches consent given Procedure consent: procedure consent matches procedure scheduled Site marked: the operative site was marked Required items: required blood products, implants, devices, and special equipment available Patient identity confirmed: verbally with patient and arm band Time out: Immediately prior to procedure a "time out" was called to verify the correct patient, procedure, equipment, support staff and site/side marked as required. Preparation: Patient was prepped and draped in the usual sterile fashion. Local anesthesia used: no Patient sedated: no Patient tolerance: Patient tolerated the procedure well with no immediate  complications Comments: Reduction of direct right inguinal hernia with moderate pain but no complications     MDM   Final diagnoses:  Unilateral recurrent inguinal hernia without obstruction or gangrene  Groin pain, right   Dustin Perry presents with right inguinal hernia. Hernia was reduced at bedside in a Trendelenburg position without complication.    Discussed with case management who verified the patient does have an orange card. [facility] was [facility] and they reported a need for referral from primary care physician. Primary care physician was also contacted who reports they will make referral today.  All this was discussed with the patient. He is persistent concern about increased pain over the last few days. Stop her surgeon for Vicodin was written. Anticipate evaluation by [facility] within the next several weeks. Also discussed home measures for reducing hernias.  Patient noted to be hypertensive after arrival to the emergency department. I believe this is likely due to pain as it has decreased along with his heart rate.  Discussed need for PCP follow-up on this for recheck.  BP 153/97 mmHg  Pulse 69  Temp(Src) 98 F (36.7 C) (Oral)  Resp 16  Ht 5\' 7"  (1.702 m)  Wt 82.555 kg  BMI 28.50 kg/m2  SpO2 98%   Abigail Butts, PA-C 07/22/15 [place], MD 07/22/15 [phone]

## 2015-07-22 NOTE — ED Notes (Signed)
Pt verbalizes understanding of instructions. 

## 2015-07-22 NOTE — Telephone Encounter (Signed)
Patient needs referral sent to [facility] for hernia repair surgery. Please fax referral to [phone]. Phone [phone] ext [phone] to [facility] who can expedite surgery with [place].

## 2015-07-29 ENCOUNTER — Other Ambulatory Visit: Payer: Self-pay | Admitting: Surgery

## 2015-07-29 MED FILL — traMADol HCL 50 MG TABS: 50 | 5 days supply | Qty: 40 | Fill #0

## 2015-07-29 MED FILL — NAPROXEN 500 MG TABLET: 500 | 15 days supply | Qty: 30 | Fill #0

## 2015-07-29 NOTE — H&P (Signed)
Dustin Perry 07/29/2015 9:29 AM Location: [facility] Patient #: [id number] DOB: [birth year] Married / Language: Spanish / Race: Refused to Report/Unreported Male  History of Present Illness Dustin Hector MD; 07/29/2015 10:05 AM) The patient is a 55 year old male who presents with an inguinal hernia. Note for "Inguinal hernia": Patient sent for surgical consultation by Cammie Sickle, nurse practitioner. Concern for worsening inguinal hernia.  Hispanic male. Originally from [place]. Donated his left kidney to his aunt when he lives there. His only abdominal surgery. Patient works as a Biomedical scientist with moderate activity. He began to notice right groin pain in the fall. Went to Primary care office. He was diagnosed with a right inguinal hernia. Surgical consultation recommended. He was due to see [place] last month but due to insurance issues had to reschedule to today. He went to the emergency room with episode of worsening pain and given narcotic pain medication. The hernia is out all the time. He has to lie flat with echo back in. Can be a struggle. Needing narcotics now. Cannot work since the pain is uncomfortable. Normally he is rather active. No history of heart attack or strokes. He does not smoke. No problems with urination. Usually moves his bowels twice a day. No history of skin infections.   Other Problems Illene Regulus, CMA; 07/29/2015 9:29 AM) Inguinal Hernia  Past Surgical History Lars Mage Spillers, CMA; 07/29/2015 9:29 AM) Nephrectomy Right.  Allergies (Alisha Spillers, CMA; 07/29/2015 9:30 AM) No Known Drug Allergies 07/29/2015  Medication History (Alisha Spillers, CMA; 07/29/2015 9:31 AM) Medications Reconciled  Social History Lars Mage Spillers, CMA; 07/29/2015 9:29 AM) Alcohol use Occasional alcohol use. Illicit drug use Uses socially only. Tobacco use Former smoker.  Family History Illene Regulus, CMA; 07/29/2015 9:29 AM) Respiratory  Condition Mother.     Review of Systems Lars Mage Spillers CMA; 07/29/2015 9:29 AM) General Not Present- Appetite Loss, Chills, Fatigue, Fever, Night Sweats, Weight Gain and Weight Loss. Skin Not Present- Change in Wart/Mole, Dryness, Hives, Jaundice, New Lesions, Non-Healing Wounds, Rash and Ulcer. HEENT Not Present- Earache, Hearing Loss, Hoarseness, Nose Bleed, Oral Ulcers, Ringing in the Ears, Seasonal Allergies, Sinus Pain, Sore Throat, Visual Disturbances, Wears glasses/contact lenses and Yellow Eyes. Respiratory Not Present- Bloody sputum, Chronic Cough, Difficulty Breathing, Snoring and Wheezing. Breast Not Present- Breast Mass, Breast Pain, Nipple Discharge and Skin Changes. Gastrointestinal Present- Abdominal Pain, Constipation and Nausea. Not Present- Bloating, Bloody Stool, Change in Bowel Habits, Chronic diarrhea, Difficulty Swallowing, Excessive gas, Gets full quickly at meals, Hemorrhoids, Indigestion, Rectal Pain and Vomiting. Male Genitourinary Not Present- Blood in Urine, Change in Urinary Stream, Frequency, Impotence, Nocturia, Painful Urination, Urgency and Urine Leakage. Neurological Present- Headaches. Not Present- Decreased Memory, Fainting, Numbness, Seizures, Tingling, Tremor, Trouble walking and Weakness.  Vitals (Alisha Spillers CMA; 07/29/2015 9:30 AM) 07/29/2015 9:29 AM Weight: 173 lb Height: 67in Body Surface Area: 1.9 m Body Mass Index: 27.1 kg/m  Pulse: 76 (Regular)  BP: 142/82 (Sitting, Left Arm, Standard)      Physical Exam Dustin Hector MD; 07/29/2015 10:06 AM)  General Mental Status-Alert. General Appearance-Not in acute distress, Not Sickly. Orientation-Oriented X3. Hydration-Well hydrated. Voice-Normal.  Integumentary Global Assessment Upon inspection and palpation of skin surfaces of the - Axillae: non-tender, no inflammation or ulceration, no drainage. and Distribution of scalp and body hair is normal. General  Characteristics Temperature - normal warmth is noted.  Head and Neck Head-normocephalic, atraumatic with no lesions or palpable masses. Face Global Assessment - atraumatic, no absence of expression.  Neck Global Assessment - no abnormal movements, no bruit auscultated on the right, no bruit auscultated on the left, no decreased range of motion, non-tender. Trachea-midline. Thyroid Gland Characteristics - non-tender.  Eye Eyeball - Left-Extraocular movements intact, No Nystagmus. Eyeball - Right-Extraocular movements intact, No Nystagmus. Cornea - Left-No Hazy. Cornea - Right-No Hazy. Sclera/Conjunctiva - Left-No scleral icterus, No Discharge. Sclera/Conjunctiva - Right-No scleral icterus, No Discharge. Pupil - Left-Direct reaction to light normal. Pupil - Right-Direct reaction to light normal.  ENMT Ears Pinna - Left - no drainage observed, no generalized tenderness observed. Right - no drainage observed, no generalized tenderness observed. Nose and Sinuses External Inspection of the Nose - no destructive lesion observed. Inspection of the nares - Left - quiet respiration. Right - quiet respiration. Mouth and Throat Lips - Upper Lip - no fissures observed, no pallor noted. Lower Lip - no fissures observed, no pallor noted. Nasopharynx - no discharge present. Oral Cavity/Oropharynx - Tongue - no dryness observed. Oral Mucosa - no cyanosis observed. Hypopharynx - no evidence of airway distress observed.  Chest and Lung Exam Inspection Movements - Normal and Symmetrical. Accessory muscles - No use of accessory muscles in breathing. Palpation Palpation of the chest reveals - Non-tender. Auscultation Breath sounds - Normal and Clear.  Cardiovascular Auscultation Rhythm - Regular. Murmurs & Other Heart Sounds - Auscultation of the heart reveals - No Murmurs and No Systolic Clicks.  Abdomen Inspection Inspection of the abdomen reveals - No Visible peristalsis  and No Abnormal pulsations. Umbilicus - No Bleeding, No Urine drainage. Palpation/Percussion Palpation and Percussion of the abdomen reveal - Soft, Non Tender, No Rebound tenderness, No Rigidity (guarding) and No Cutaneous hyperesthesia. Note: Abdomen soft and flat. Large midline incision. No hernias. No umbilical hernia. No diastases. Nontender. Nondistended.  Male Genitourinary Sexual Maturity Tanner 5 - Adult hair pattern and Adult penile size and shape. Note: Normal external male genitalia. Obvious moderate size right internal hernia to mid scrotum. Very sensitive. Ultimately reducible. Sensitive and left groin with subtle impulse suspicious for small left inguinal hernia as well. Testicles sensitive but no discrete masses.  Peripheral Vascular Upper Extremity Inspection - Left - No Cyanotic nailbeds, Not Ischemic. Right - No Cyanotic nailbeds, Not Ischemic.  Neurologic Neurologic evaluation reveals -normal attention span and ability to concentrate, able to name objects and repeat phrases. Appropriate fund of knowledge , normal sensation and normal coordination. Mental Status Affect - not angry, not paranoid. Cranial Nerves-Normal Bilaterally. Gait-Normal.  Neuropsychiatric Mental status exam performed with findings of-able to articulate well with normal speech/language, rate, volume and coherence, thought content normal with ability to perform basic computations and apply abstract reasoning and no evidence of hallucinations, delusions, obsessions or homicidal/suicidal ideation.  Musculoskeletal Global Assessment Spine, Ribs and Pelvis - no instability, subluxation or laxity. Right Upper Extremity - no instability, subluxation or laxity.  Lymphatic Head & Neck  General Head & Neck Lymphatics: Bilateral - Description - No Localized lymphadenopathy. Axillary  General Axillary Region: Bilateral - Description - No Localized lymphadenopathy. Femoral &  Inguinal  Generalized Femoral & Inguinal Lymphatics: Left - Description - No Localized lymphadenopathy. Right - Description - No Localized lymphadenopathy.    Assessment & Plan Dustin Hector MD; 07/29/2015 10:08 AM)  BILATERAL INGUINAL HERNIA WITHOUT OBSTRUCTION OR GANGRENE, RECURRENCE NOT SPECIFIED (K40.20) Impression: Enlarging and very sensitive right inguinal hernia. Smaller but sensitive left inguinal hernia as well.  I think he would benefit from surgical repair. Dry laps Or preperitoneal approach. Palpation surgery. I  think he cannot wait much longer.  In the meantime, ice/sheet, anti-inflammatories. We'll give higher dose naproxen. Can give a few more narcotics as needed. Do not want to give them to them indefinitely. He really wants to get back to work but cannot function. We'll try and do this urgently.  Current Plans Started Naproxen 500MG , 1 (one) Tablet two times daily, #30, 07/29/2015, Ref. x1. Started TraMADol HCl 50MG , 1-2 Tablet every six hours, as needed, #40, 07/29/2015, No Refill. ENCOUNTER FOR PRE-OP - GROIN HERNIA (Z01.818)  Current Plans You are being scheduled for surgery - Our schedulers will call you.  You should hear from our office's scheduling department within 5 working days about the location, date, and time of surgery. We try to make accommodations for patient's preferences in scheduling surgery, but sometimes the OR schedule or the surgeon's schedule prevents [place] from making those accommodations.  If you have not heard from our office [phone]) in 5 working days, call the office and ask for your surgeon's nurse.  If you have other questions about your diagnosis, plan, or surgery, call the office and ask for your surgeon's nurse.  Pt Education - Pamphlet Given - Laparoscopic Hernia Repair: discussed with patient and provided information. The anatomy & physiology of the abdominal wall and pelvic floor was discussed. The pathophysiology of hernias in  the inguinal and pelvic region was discussed. Natural history risks such as progressive enlargement, pain, incarceration, and strangulation was discussed. Contributors to complications such as smoking, obesity, diabetes, prior surgery, etc were discussed.  I feel the risks of no intervention will lead to serious problems that outweigh the operative risks; therefore, I recommended surgery to reduce and repair the hernia. I explained laparoscopic techniques with possible need for an open approach. I noted usual use of mesh to patch and/or buttress hernia repair  Risks such as bleeding, infection, abscess, need for further treatment, heart attack, death, and other risks were discussed. I noted a good likelihood this will help address the problem. Goals of post-operative recovery were discussed as well. Possibility that this will not correct all symptoms was explained. I stressed the importance of low-impact activity, aggressive pain control, avoiding constipation, & not pushing through pain to minimize risk of post-operative chronic pain or injury. Possibility of reherniation was discussed. We will work to minimize complications.  An educational handout further explaining the pathology & treatment options was given as well. Questions were answered. The patient expresses understanding & wishes to proceed with surgery.  Pt Education - [facility] Hernia Post-Op HCI (Maryela Tapper): discussed with patient and provided information.  Dustin Perry, M.D., F.A.C.S. Gastrointestinal and Minimally Invasive Surgery [facility] [address] [place], [place] [address] [phone] Main / Paging

## 2015-07-30 ENCOUNTER — Telehealth: Payer: Self-pay

## 2015-07-30 NOTE — Telephone Encounter (Signed)
Patient called asking if he could have tramadol refilled for pain. His surgery is scheduled for 08/06/2015 for the Hernia Repair. Please advise. Thanks!

## 2015-08-04 ENCOUNTER — Encounter (HOSPITAL_COMMUNITY)
Admission: RE | Admit: 2015-08-04 | Discharge: 2015-08-04 | Disposition: A | Discharge status: Home / Self Care | Payer: No Typology Code available for payment source | Source: Ambulatory Visit | Attending: Surgery | Admitting: Surgery

## 2015-08-04 ENCOUNTER — Encounter (HOSPITAL_COMMUNITY): Payer: Self-pay

## 2015-08-04 DIAGNOSIS — Z01812 Encounter for preprocedural laboratory examination: Secondary | ICD-10-CM | POA: Insufficient documentation

## 2015-08-04 DIAGNOSIS — K4020 Bilateral inguinal hernia, without obstruction or gangrene, not specified as recurrent: Secondary | ICD-10-CM | POA: Insufficient documentation

## 2015-08-04 LAB — CBC
HEMATOCRIT: 42.1 % (ref 39.0–52.0)
HEMOGLOBIN: 14.4 g/dL (ref 13.0–17.0)
MCH: 32.5 pg (ref 26.0–34.0)
MCHC: 34.2 g/dL (ref 30.0–36.0)
MCV: 95 fL (ref 78.0–100.0)
Platelets: 361 10*3/uL (ref 150–400)
RBC: 4.43 MIL/uL (ref 4.22–5.81)
RDW: 12.5 % (ref 11.5–15.5)
WBC: 8.1 10*3/uL (ref 4.0–10.5)

## 2015-08-04 NOTE — Patient Instructions (Signed)
Kieth Ybarbo Raisanen  08/04/2015   Your procedure is scheduled on: 08-06-15  Report to [facility] Main  Entrance take [facility]  elevators to 3rd floor to  [place] at   [phone] AM.  Call this number if you have problems the morning of surgery [phone]   Remember: ONLY 1 PERSON MAY GO WITH YOU TO SHORT STAY TO GET  READY MORNING OF [place].  Do not eat food or drink liquids :After Midnight.     Take these medicines the morning of surgery with A SIP OF WATER: Tramadol-if need. DO NOT TAKE ANY DIABETIC MEDICATIONS DAY OF YOUR SURGERY                               You may not have any metal on your body including hair pins and              piercings  Do not wear jewelry, make-up, lotions, powders or perfumes, deodorant             Do not wear nail polish.  Do not shave  48 hours prior to surgery.              Men may shave face and neck.   Do not bring valuables to the hospital. [place].  Contacts, dentures or bridgework may not be worn into surgery.  Leave suitcase in the car. After surgery it may be brought to your room.     Patients discharged the day of surgery will not be allowed to drive home.  Name and phone number of your driver: [phone] cell  Special Instructions: N/A              Please read over the following fact sheets you were given: _____________________________________________________________________             [facility] - Preparing for Surgery Before surgery, you can play an important role.  Because skin is not sterile, your skin needs to be as free of germs as possible.  You can reduce the number of germs on your skin by washing with CHG (chlorahexidine gluconate) soap before surgery.  CHG is an antiseptic cleaner which kills germs and bonds with the skin to continue killing germs even after washing. Please DO NOT use if you have an allergy to CHG or  antibacterial soaps.  If your skin becomes reddened/irritated stop using the CHG and inform your nurse when you arrive at Short Stay. Do not shave (including legs and underarms) for at least 48 hours prior to the first CHG shower.  You may shave your face/neck. Please follow these instructions carefully:  1.  Shower with CHG Soap the night before surgery and the  morning of Surgery.  2.  If you choose to wash your hair, wash your hair first as usual with your  normal  shampoo.  3.  After you shampoo, rinse your hair and body thoroughly to remove the  shampoo.                           4.  Use CHG as you would any other liquid soap.  You can apply chg directly  to the skin and wash                       Gently with a scrungie or clean washcloth.  5.  Apply the CHG Soap to your body ONLY FROM THE NECK DOWN.   Do not use on face/ open                           Wound or open sores. Avoid contact with eyes, ears mouth and genitals (private parts).                       Wash face,  Genitals (private parts) with your normal soap.             6.  Wash thoroughly, paying special attention to the area where your surgery  will be performed.  7.  Thoroughly rinse your body with warm water from the neck down.  8.  DO NOT shower/wash with your normal soap after using and rinsing off  the CHG Soap.                9.  Pat yourself dry with a clean towel.            10.  Wear clean pajamas.            11.  Place clean sheets on your bed the night of your first shower and do not  sleep with pets. Day of Surgery : Do not apply any lotions/deodorants the morning of surgery.  Please wear clean clothes to the hospital/surgery center.  FAILURE TO FOLLOW THESE INSTRUCTIONS MAY RESULT IN THE CANCELLATION OF YOUR SURGERY PATIENT SIGNATURE_________________________________  NURSE SIGNATURE__________________________________  ________________________________________________________________________

## 2015-08-06 ENCOUNTER — Encounter (HOSPITAL_COMMUNITY): Payer: Self-pay | Admitting: *Deleted

## 2015-08-06 ENCOUNTER — Ambulatory Visit (HOSPITAL_COMMUNITY): Payer: Self-pay | Admitting: Anesthesiology

## 2015-08-06 ENCOUNTER — Encounter (HOSPITAL_COMMUNITY)
Admission: RE | Disposition: A | Discharge status: Home / Self Care | Payer: Self-pay | Source: Ambulatory Visit | Attending: Surgery

## 2015-08-06 ENCOUNTER — Ambulatory Visit (HOSPITAL_COMMUNITY)
Admission: RE | Admit: 2015-08-06 | Discharge: 2015-08-06 | Disposition: A | Discharge status: Home / Self Care | Payer: Self-pay | Source: Ambulatory Visit | Attending: Surgery | Admitting: Surgery

## 2015-08-06 DIAGNOSIS — D176 Benign lipomatous neoplasm of spermatic cord: Secondary | ICD-10-CM | POA: Insufficient documentation

## 2015-08-06 DIAGNOSIS — K4020 Bilateral inguinal hernia, without obstruction or gangrene, not specified as recurrent: Secondary | ICD-10-CM | POA: Insufficient documentation

## 2015-08-06 DIAGNOSIS — K4090 Unilateral inguinal hernia, without obstruction or gangrene, not specified as recurrent: Secondary | ICD-10-CM

## 2015-08-06 DIAGNOSIS — R1031 Right lower quadrant pain: Secondary | ICD-10-CM

## 2015-08-06 DIAGNOSIS — Z905 Acquired absence of kidney: Secondary | ICD-10-CM | POA: Insufficient documentation

## 2015-08-06 DIAGNOSIS — Z87891 Personal history of nicotine dependence: Secondary | ICD-10-CM | POA: Insufficient documentation

## 2015-08-06 HISTORY — PX: INGUINAL HERNIA REPAIR: SHX194

## 2015-08-06 HISTORY — PX: INSERTION OF MESH: SHX5868

## 2015-08-06 SURGERY — REPAIR, HERNIA, INGUINAL, BILATERAL, LAPAROSCOPIC
Anesthesia: General | Laterality: Bilateral

## 2015-08-06 MED ORDER — CEFAZOLIN SODIUM-DEXTROSE 2-3 GM-% IV SOLR
2.0000 g | INTRAVENOUS | Status: AC
  Administered 2015-08-06: 2 g via INTRAVENOUS

## 2015-08-06 MED ORDER — SUGAMMADEX SODIUM 200 MG/2ML IV SOLN
INTRAVENOUS | Status: DC | PRN
  Administered 2015-08-06: 200 mg via INTRAVENOUS

## 2015-08-06 MED ORDER — TRAMADOL HCL 50 MG PO TABS
50.0000 mg | ORAL_TABLET | Freq: Four times a day (QID) | ORAL | Status: DC | PRN
  Administered 2015-08-06: 50 mg via ORAL
  Filled 2015-08-06 (×2): qty 2

## 2015-08-06 MED ORDER — MIDAZOLAM HCL 2 MG/2ML IJ SOLN
INTRAMUSCULAR | Status: AC
  Filled 2015-08-06: qty 2

## 2015-08-06 MED ORDER — DEXAMETHASONE SODIUM PHOSPHATE 10 MG/ML IJ SOLN
INTRAMUSCULAR | Status: DC | PRN
  Administered 2015-08-06: 5 mg via INTRAVENOUS

## 2015-08-06 MED ORDER — CEFAZOLIN SODIUM-DEXTROSE 2-3 GM-% IV SOLR
INTRAVENOUS | Status: AC
  Filled 2015-08-06: qty 50

## 2015-08-06 MED ORDER — ROCURONIUM BROMIDE 100 MG/10ML IV SOLN
INTRAVENOUS | Status: DC | PRN
  Administered 2015-08-06: 10 mg via INTRAVENOUS
  Administered 2015-08-06: 50 mg via INTRAVENOUS
  Administered 2015-08-06 (×2): 20 mg via INTRAVENOUS

## 2015-08-06 MED ORDER — ROCURONIUM BROMIDE 100 MG/10ML IV SOLN
INTRAVENOUS | Status: AC
  Filled 2015-08-06: qty 1

## 2015-08-06 MED ORDER — FENTANYL CITRATE (PF) 100 MCG/2ML IJ SOLN
INTRAMUSCULAR | Status: DC | PRN
  Administered 2015-08-06 (×2): 50 ug via INTRAVENOUS
  Administered 2015-08-06: 100 ug via INTRAVENOUS
  Administered 2015-08-06: 50 ug via INTRAVENOUS

## 2015-08-06 MED ORDER — 0.9 % SODIUM CHLORIDE (POUR BTL) OPTIME
TOPICAL | Status: DC | PRN
  Administered 2015-08-06: 1000 mL

## 2015-08-06 MED ORDER — PROPOFOL 10 MG/ML IV BOLUS
INTRAVENOUS | Status: AC
  Filled 2015-08-06: qty 20

## 2015-08-06 MED ORDER — LIDOCAINE HCL (CARDIAC) 20 MG/ML IV SOLN
INTRAVENOUS | Status: DC | PRN
  Administered 2015-08-06: 100 mg via INTRAVENOUS

## 2015-08-06 MED ORDER — SUGAMMADEX SODIUM 200 MG/2ML IV SOLN
INTRAVENOUS | Status: AC
  Filled 2015-08-06: qty 2

## 2015-08-06 MED ORDER — TRAMADOL HCL 50 MG PO TABS: 50.0000 mg | ORAL_TABLET | Freq: Four times a day (QID) | ORAL | Status: DC | PRN

## 2015-08-06 MED ORDER — STERILE WATER FOR IRRIGATION IR SOLN
Status: DC | PRN
  Administered 2015-08-06: 1500 mL

## 2015-08-06 MED ORDER — KETOROLAC TROMETHAMINE 30 MG/ML IJ SOLN
INTRAMUSCULAR | Status: AC
Start: 2015-08-06 — End: 2015-08-06
  Filled 2015-08-06: qty 2

## 2015-08-06 MED ORDER — ONDANSETRON HCL 4 MG/2ML IJ SOLN
INTRAMUSCULAR | Status: DC | PRN
  Administered 2015-08-06 (×4): 2 mg via INTRAVENOUS

## 2015-08-06 MED ORDER — ONDANSETRON HCL 4 MG/2ML IJ SOLN
INTRAMUSCULAR | Status: AC
Start: 2015-08-06 — End: 2015-08-06
  Filled 2015-08-06: qty 2

## 2015-08-06 MED ORDER — HYDROMORPHONE HCL 1 MG/ML IJ SOLN
INTRAMUSCULAR | Status: AC
  Filled 2015-08-06: qty 1

## 2015-08-06 MED ORDER — LACTATED RINGERS IV SOLN: INTRAVENOUS | Status: DC

## 2015-08-06 MED ORDER — KETOROLAC TROMETHAMINE 30 MG/ML IJ SOLN
INTRAMUSCULAR | Status: AC
  Filled 2015-08-06: qty 1

## 2015-08-06 MED ORDER — FENTANYL CITRATE (PF) 250 MCG/5ML IJ SOLN
INTRAMUSCULAR | Status: AC
  Filled 2015-08-06: qty 5

## 2015-08-06 MED ORDER — PROMETHAZINE HCL 25 MG/ML IJ SOLN: 6.2500 mg | INTRAMUSCULAR | Status: DC | PRN

## 2015-08-06 MED ORDER — CHLORHEXIDINE GLUCONATE 4 % EX LIQD: 1.0000 "application " | Freq: Once | CUTANEOUS | Status: DC

## 2015-08-06 MED ORDER — MIDAZOLAM HCL 2 MG/2ML IJ SOLN: 0.5000 mg | Freq: Once | INTRAMUSCULAR | Status: DC | PRN

## 2015-08-06 MED ORDER — KETOROLAC TROMETHAMINE 30 MG/ML IJ SOLN
INTRAMUSCULAR | Status: DC | PRN
  Administered 2015-08-06: 15 mg via INTRAVENOUS

## 2015-08-06 MED ORDER — PHENYLEPHRINE 40 MCG/ML (10ML) SYRINGE FOR IV PUSH (FOR BLOOD PRESSURE SUPPORT)
PREFILLED_SYRINGE | INTRAVENOUS | Status: AC
  Filled 2015-08-06: qty 20

## 2015-08-06 MED ORDER — PROPOFOL 10 MG/ML IV BOLUS
INTRAVENOUS | Status: DC | PRN
  Administered 2015-08-06: 150 mg via INTRAVENOUS

## 2015-08-06 MED ORDER — MIDAZOLAM HCL 5 MG/5ML IJ SOLN
INTRAMUSCULAR | Status: DC | PRN
  Administered 2015-08-06: 2 mg via INTRAVENOUS

## 2015-08-06 MED ORDER — LACTATED RINGERS IV SOLN
INTRAVENOUS | Status: DC | PRN
  Administered 2015-08-06 (×2): via INTRAVENOUS

## 2015-08-06 MED ORDER — LIDOCAINE HCL (CARDIAC) 20 MG/ML IV SOLN
INTRAVENOUS | Status: AC
  Filled 2015-08-06: qty 5

## 2015-08-06 MED ORDER — HYDROMORPHONE HCL 1 MG/ML IJ SOLN
0.2500 mg | INTRAMUSCULAR | Status: DC | PRN
  Administered 2015-08-06 (×3): 0.25 mg via INTRAVENOUS
  Administered 2015-08-06: 0.5 mg via INTRAVENOUS

## 2015-08-06 MED ORDER — NAPROXEN 500 MG PO TABS
500.0000 mg | ORAL_TABLET | Freq: Two times a day (BID) | ORAL | Status: DC
Start: 2015-08-06 — End: 2016-08-17

## 2015-08-06 MED ORDER — BUPIVACAINE-EPINEPHRINE (PF) 0.25% -1:200000 IJ SOLN
INTRAMUSCULAR | Status: AC
  Filled 2015-08-06: qty 30

## 2015-08-06 MED ORDER — BUPIVACAINE-EPINEPHRINE 0.25% -1:200000 IJ SOLN
INTRAMUSCULAR | Status: DC | PRN
  Administered 2015-08-06: 79 mL

## 2015-08-06 MED ORDER — DEXAMETHASONE SODIUM PHOSPHATE 10 MG/ML IJ SOLN
INTRAMUSCULAR | Status: AC
  Filled 2015-08-06: qty 1

## 2015-08-06 MED ORDER — MEPERIDINE HCL 50 MG/ML IJ SOLN: 6.2500 mg | INTRAMUSCULAR | Status: DC | PRN

## 2015-08-06 MED ORDER — BUPIVACAINE-EPINEPHRINE 0.25% -1:200000 IJ SOLN
INTRAMUSCULAR | Status: AC
  Filled 2015-08-06: qty 1

## 2015-08-06 SURGICAL SUPPLY — 35 items
CABLE HIGH FREQUENCY MONO STRZ (ELECTRODE) ×2 IMPLANT
CHLORAPREP W/TINT 26ML (MISCELLANEOUS) ×3 IMPLANT
COVER SURGICAL LIGHT HANDLE (MISCELLANEOUS) ×3 IMPLANT
DECANTER SPIKE VIAL GLASS SM (MISCELLANEOUS) ×3 IMPLANT
DEVICE SECURE STRAP 25 ABSORB (INSTRUMENTS) IMPLANT
DRAPE LAPAROSCOPIC ABDOMINAL (DRAPES) ×3 IMPLANT
DRAPE WARM FLUID 44X44 (DRAPE) ×3 IMPLANT
DRSG TEGADERM 2-3/8X2-3/4 SM (GAUZE/BANDAGES/DRESSINGS) ×4 IMPLANT
DRSG TEGADERM 4X4.75 (GAUZE/BANDAGES/DRESSINGS) ×3 IMPLANT
ELECT REM PT RETURN 9FT ADLT (ELECTROSURGICAL) ×3
ELECTRODE REM PT RTRN 9FT ADLT (ELECTROSURGICAL) ×1 IMPLANT
GAUZE SPONGE 2X2 8PLY STRL LF (GAUZE/BANDAGES/DRESSINGS) IMPLANT
GLOVE ECLIPSE 8.0 STRL XLNG CF (GLOVE) ×3 IMPLANT
GLOVE INDICATOR 8.0 STRL GRN (GLOVE) ×3 IMPLANT
GOWN STRL REUS W/TWL XL LVL3 (GOWN DISPOSABLE) ×6 IMPLANT
KIT BASIN OR (CUSTOM PROCEDURE TRAY) ×3 IMPLANT
MESH ULTRAPRO 6X6 15CM15CM (Mesh General) ×6 IMPLANT
PAD POSITIONING PINK XL (MISCELLANEOUS) ×3 IMPLANT
POSITIONER SURGICAL ARM (MISCELLANEOUS) IMPLANT
SCISSORS LAP 5X35 DISP (ENDOMECHANICALS) ×3 IMPLANT
SET IRRIG TUBING LAPAROSCOPIC (IRRIGATION / IRRIGATOR) IMPLANT
SLEEVE XCEL OPT CAN 5 100 (ENDOMECHANICALS) IMPLANT
SPONGE GAUZE 2X2 STER 10/PKG (GAUZE/BANDAGES/DRESSINGS) ×2
SUT MNCRL AB 4-0 PS2 18 (SUTURE) ×3 IMPLANT
SUT VIC AB 3-0 SH 27 (SUTURE)
SUT VIC AB 3-0 SH 27XBRD (SUTURE) IMPLANT
SUT VICRYL 0 UR6 27IN ABS (SUTURE) ×2 IMPLANT
TACKER 5MM HERNIA 3.5CML NAB (ENDOMECHANICALS) IMPLANT
TAPE CLOTH 4X10 WHT NS (GAUZE/BANDAGES/DRESSINGS) IMPLANT
TOWEL OR 17X26 10 PK STRL BLUE (TOWEL DISPOSABLE) ×3 IMPLANT
TRAY LAPAROSCOPIC (CUSTOM PROCEDURE TRAY) ×3 IMPLANT
TROCAR BLADELESS OPT 5 100 (ENDOMECHANICALS) IMPLANT
TROCAR CANNULA W/PORT DUAL 5MM (MISCELLANEOUS) ×4 IMPLANT
TROCAR XCEL BLUNT TIP 100MML (ENDOMECHANICALS) ×3 IMPLANT
TUBING INSUF HEATED (TUBING) ×3 IMPLANT

## 2015-08-06 NOTE — Op Note (Signed)
08/06/2015  1:10 PM  PATIENT:  Dustin Perry  55 y.o. male  Patient Care Team: No Pcp Per Patient as PCP - General (General Practice)  PRE-OPERATIVE DIAGNOSIS:  bilateral inguinal hernia  POST-OPERATIVE DIAGNOSIS:  bilateral inguinal hernia  PROCEDURE:   LAPAROSCOPIC BILATERAL INGUINAL HERNIA REPAIR INSERTION OF MESH  SURGEON:  Surgeon(s): Michael Boston, MD  ASSISTANT: none   ANESTHESIA:   Regional ilioinguinal and genitofemoral and spermatic cord nerve blocks with GETA  EBL:  Total I/O In: 1000 [I.V.:1000] Out: -   Delay start of Pharmacological VTE agent (>24hrs) due to surgical blood loss or risk of bleeding:  no  DRAINS: NONE  SPECIMEN:  Hernia sac & cord lipomas (not sent)  DISPOSITION OF SPECIMEN:  N/A  COUNTS:  YES  PLAN OF CARE: Discharge to home after PACU  PATIENT DISPOSITION:  PACU - hemodynamically stable.  INDICATION: Active male with worsening right inguinal hernia.  Moderate size.  Strongly suspicious for a left inguinal hernia as well.  I offered laparoscopic exploration and repair of hernias found.  The anatomy & physiology of the abdominal wall and pelvic floor was discussed.  The pathophysiology of hernias in the inguinal and pelvic region was discussed.  Natural history risks such as progressive enlargement, pain, incarceration & strangulation was discussed.   Contributors to complications such as smoking, obesity, diabetes, prior surgery, etc were discussed.    I feel the risks of no intervention will lead to serious problems that outweigh the operative risks; therefore, I recommended surgery to reduce and repair the hernia.  I explained laparoscopic techniques with possible need for an open approach.  I noted usual use of mesh to patch and/or buttress hernia repair  Risks such as bleeding, infection, abscess, need for further treatment, heart attack, death, and other risks were discussed.  I noted a good likelihood this will help address the  problem.   Goals of post-operative recovery were discussed as well.  Possibility that this will not correct all symptoms was explained.  I stressed the importance of low-impact activity, aggressive pain control, avoiding constipation, & not pushing through pain to minimize risk of post-operative chronic pain or injury. Possibility of reherniation was discussed.  We will work to minimize complications.     An educational handout further explaining the pathology & treatment options was given as well.  Questions were answered.  The patient expresses understanding & wishes to proceed with surgery.  OR FINDINGS: Bilateral indirect inguinal hernias.  Right side was rather large chronic hernia sac and large spermatic cord lipomas.  Less prominent but definite spermatic cord lipomas on the left side.  DESCRIPTION:   The patient was identified & brought into the operating room. The patient was positioned supine with arms tucked. SCDs were active during the entire case. The patient underwent general anesthesia without any difficulty.  The abdomen was prepped and draped in a sterile fashion. The patient's bladder was emptied.  A Surgical Timeout confirmed our plan.  I made a transverse incision through the inferior umbilical fold.  I made a small transverse nick through the anterior rectus fascia contralateral to the inguinal hernia side and placed a 0-vicryl stitch through the fascia.  I placed a Hasson trocar into the preperitoneal plane.  Entry was clean.  We induced carbon dioxide insufflation. Camera inspection revealed no injury.  I used a 72mm angled scope to bluntly free the peritoneum off the infraumbilical anterior abdominal wall.  I created enough of a preperitoneal pocket to  place 97mm ports into the right & left mid-abdomen into this preperitoneal cavity.  I focused attention on the RIGHT side since that was the dominant hernia side.   I used blunt & focused sharp dissection to free the peritoneum off  the flank and down to the pubic rim.  I freed the anteriolateral bladder wall off the anteriolateral pelvic wall, sparing midline attachments.   I located a swath of peritoneum going into a hernia fascial defect at the internal ring consistent with an indirect inguinal hernia.  I gradually freed the peritoneal hernia sac off safely and reduced it into the preperitoneal space.  I freed the peritoneum off the spermatic vessels & vas deferens.  I freed peritoneum off the retroperitoneum along the psoas muscle.    I checked & assured hemostasis.    I turned attention on the opposite side.  I did dissection in a similar, mirror-image fashion. The patient had a smaller indirect inguinal hernia.      I chose 15x15 cm sheets of ultra-lightweight polypropylene mesh (Ultrapro), one for each side.  I cut a single sigmoid-shaped slit ~6cm from a corner of each mesh.  I placed the meshes into the preperitoneal space & laid them as overlapping diamonds such that at the inferior points, a 6x6 cm corner flap rested in the true anterolateral pelvis, covering the obturator & femoral foramina.   I allowed the bladder to return to the pubis, this helping tuck the corners of the mesh in the anteriolateral pelvis.  The medial corners overlapped each other across midline cephalad to the pubic rim.   This provided >2 inch coverage around the hernia.  Because the defects were of moderate size, I did place a third piece of mesh as a diamond along the midline.  The lateral wings of this overlapped towards the direct space and internal rings of each side..  This provided excellent overlap.  Because of the good overlap, I did not place any tacks.  I held the hernia sacs cephalad & evacuated carbon dioxide.  I closed the fascia with absorbable suture.  I closed the skin using 4-0 monocryl stitch.  Sterile dressings were applied. The patient was extubated & arrived in the PACU in stable condition..  I had discussed postoperative care with  the patient in the holding area.   Patient notes his nephews coming to get him.  Did not want me to talk to him.  No one is available right now.  Instructions are written in the chart.  Adin Hector, M.D., F.A.C.S. Gastrointestinal and Minimally Invasive Surgery [facility] [address] [place], [place] [address] [phone] Main / Paging

## 2015-08-06 NOTE — Transfer of Care (Signed)
Immediate Anesthesia Transfer of Care Note  Patient: Dustin Perry  Procedure(s) Performed: Procedure(s): LAPAROSCOPIC BILATERAL INGUINAL HERNIA REPAIR (Bilateral) INSERTION OF MESH (Bilateral)  Patient Location: PACU  Anesthesia Type:General  Level of Consciousness:  sedated, patient cooperative and responds to stimulation  Airway & Oxygen Therapy:Patient Spontanous Breathing and Patient connected to face mask oxgen  Post-op Assessment:  Report given to PACU RN and Post -op Vital signs reviewed and stable  Post vital signs:  Reviewed and stable  Last Vitals:  Filed Vitals:   08/06/15 0857  BP: 123/86  Pulse: 73  Temp: 36.8 C  Resp: 18    Complications: No apparent anesthesia complications

## 2015-08-06 NOTE — Anesthesia Postprocedure Evaluation (Signed)
Anesthesia Post Note  Patient: Dustin Perry  Procedure(s) Performed: Procedure(s) (LRB): LAPAROSCOPIC BILATERAL INGUINAL HERNIA REPAIR (Bilateral) INSERTION OF MESH (Bilateral)  Patient location during evaluation: PACU Anesthesia Type: General Level of consciousness: awake and alert, oriented and patient cooperative Pain management: pain level controlled Vital Signs Assessment: post-procedure vital signs reviewed and stable Respiratory status: spontaneous breathing, nonlabored ventilation and respiratory function stable Cardiovascular status: blood pressure returned to baseline and stable Postop Assessment: no signs of nausea or vomiting Anesthetic complications: no    Last Vitals:  Filed Vitals:   08/06/15 1400 08/06/15 1415  BP: 131/92 123/87  Pulse: 72 78  Temp:  36.4 C  Resp: 18 17    Last Pain:  Filed Vitals:   08/06/15 1417  PainSc: 3                  Arlys Scatena,E. Charna Neeb

## 2015-08-06 NOTE — Interval H&P Note (Signed)
History and Physical Interval Note:  08/06/2015 10:37 AM  Dustin Perry  has presented today for surgery, with the diagnosis of bilateral inguinal hernia  The various methods of treatment have been discussed with the patient and family. After consideration of risks, benefits and other options for treatment, the patient has consented to  Procedure(s): [place] (Bilateral) INSERTION OF MESH (Bilateral) as a surgical intervention .  The patient's history has been reviewed, patient examined, no change in status, stable for surgery.  I have reviewed the patient's chart and labs.  Questions were answered to the patient's satisfaction.     [name] C.

## 2015-08-06 NOTE — Anesthesia Preprocedure Evaluation (Addendum)
Anesthesia Evaluation  Patient identified by MRN, date of birth, ID band Patient awake    Reviewed: Allergy & Precautions, NPO status , Patient's Chart, lab work & pertinent test results  History of Anesthesia Complications Negative for: history of anesthetic complications  Airway Mallampati: II  TM Distance: >3 FB     Dental  (+) Poor Dentition, Missing, Loose, Dental Advisory Given, Chipped   Pulmonary former smoker,    breath sounds clear to auscultation       Cardiovascular negative cardio ROS   Rhythm:Regular Rate:Normal     Neuro/Psych negative neurological ROS     GI/Hepatic negative GI ROS, Neg liver ROS,   Endo/Other  negative endocrine ROS  Renal/GU negative Renal ROS     Musculoskeletal negative musculoskeletal ROS (+)   Abdominal   Peds  Hematology negative hematology ROS (+)   Anesthesia Other Findings   Reproductive/Obstetrics                            Anesthesia Physical Anesthesia Plan  ASA: I  Anesthesia Plan: General   Post-op Pain Management:    Induction: Intravenous  Airway Management Planned: Oral ETT  Additional Equipment:   Intra-op Plan:   Post-operative Plan: Extubation in OR  Informed Consent: I have reviewed the patients History and Physical, chart, labs and discussed the procedure including the risks, benefits and alternatives for the proposed anesthesia with the patient or authorized representative who has indicated his/her understanding and acceptance.   Dental advisory given  Plan Discussed with: Surgeon and CRNA  Anesthesia Plan Comments: (Plan routine monitors, GETA)        Anesthesia Quick Evaluation

## 2015-08-06 NOTE — H&P (View-Only) (Signed)
Dustin Perry 07/29/2015 9:29 AM Location: [facility] Patient #: [id number] DOB: 03-13-1961 Married / Language: Spanish / Race: Refused to Report/Unreported Male  History of Present Illness Adin Hector MD; 07/29/2015 10:05 AM) The patient is a 55 year old male who presents with an inguinal hernia. Note for "Inguinal hernia": Patient sent for surgical consultation by Cammie Sickle, nurse practitioner. Concern for worsening inguinal hernia.  Hispanic male. Originally from [place]. Donated his left kidney to his aunt when he lives there. His only abdominal surgery. Patient works as a Biomedical scientist with moderate activity. He began to notice right groin pain in the fall. Went to Primary care office. He was diagnosed with a right inguinal hernia. Surgical consultation recommended. He was due to see [place] last month but due to insurance issues had to reschedule to today. He went to the emergency room with episode of worsening pain and given narcotic pain medication. The hernia is out all the time. He has to lie flat with echo back in. Can be a struggle. Needing narcotics now. Cannot work since the pain is uncomfortable. Normally he is rather active. No history of heart attack or strokes. He does not smoke. No problems with urination. Usually moves his bowels twice a day. No history of skin infections.   Other Problems Illene Regulus, CMA; 07/29/2015 9:29 AM) Inguinal Hernia  Past Surgical History Lars Mage Spillers, CMA; 07/29/2015 9:29 AM) Nephrectomy Right.  Allergies (Alisha Spillers, CMA; 07/29/2015 9:30 AM) No Known Drug Allergies 07/29/2015  Medication History (Alisha Spillers, CMA; 07/29/2015 9:31 AM) Medications Reconciled  Social History Lars Mage Spillers, CMA; 07/29/2015 9:29 AM) Alcohol use Occasional alcohol use. Illicit drug use Uses socially only. Tobacco use Former smoker.  Family History Illene Regulus, CMA; 07/29/2015 9:29 AM) Respiratory  Condition Mother.     Review of Systems Lars Mage Spillers CMA; 07/29/2015 9:29 AM) General Not Present- Appetite Loss, Chills, Fatigue, Fever, Night Sweats, Weight Gain and Weight Loss. Skin Not Present- Change in Wart/Mole, Dryness, Hives, Jaundice, New Lesions, Non-Healing Wounds, Rash and Ulcer. HEENT Not Present- Earache, Hearing Loss, Hoarseness, Nose Bleed, Oral Ulcers, Ringing in the Ears, Seasonal Allergies, Sinus Pain, Sore Throat, Visual Disturbances, Wears glasses/contact lenses and Yellow Eyes. Respiratory Not Present- Bloody sputum, Chronic Cough, Difficulty Breathing, Snoring and Wheezing. Breast Not Present- Breast Mass, Breast Pain, Nipple Discharge and Skin Changes. Gastrointestinal Present- Abdominal Pain, Constipation and Nausea. Not Present- Bloating, Bloody Stool, Change in Bowel Habits, Chronic diarrhea, Difficulty Swallowing, Excessive gas, Gets full quickly at meals, Hemorrhoids, Indigestion, Rectal Pain and Vomiting. Male Genitourinary Not Present- Blood in Urine, Change in Urinary Stream, Frequency, Impotence, Nocturia, Painful Urination, Urgency and Urine Leakage. Neurological Present- Headaches. Not Present- Decreased Memory, Fainting, Numbness, Seizures, Tingling, Tremor, Trouble walking and Weakness.  Vitals (Alisha Spillers CMA; 07/29/2015 9:30 AM) 07/29/2015 9:29 AM Weight: 173 lb Height: 67in Body Surface Area: 1.9 m Body Mass Index: 27.1 kg/m  Pulse: 76 (Regular)  BP: 142/82 (Sitting, Left Arm, Standard)      Physical Exam Adin Hector MD; 07/29/2015 10:06 AM)  General Mental Status-Alert. General Appearance-Not in acute distress, Not Sickly. Orientation-Oriented X3. Hydration-Well hydrated. Voice-Normal.  Integumentary Global Assessment Upon inspection and palpation of skin surfaces of the - Axillae: non-tender, no inflammation or ulceration, no drainage. and Distribution of scalp and body hair is normal. General  Characteristics Temperature - normal warmth is noted.  Head and Neck Head-normocephalic, atraumatic with no lesions or palpable masses. Face Global Assessment - atraumatic, no absence of expression.  Neck Global Assessment - no abnormal movements, no bruit auscultated on the right, no bruit auscultated on the left, no decreased range of motion, non-tender. Trachea-midline. Thyroid Gland Characteristics - non-tender.  Eye Eyeball - Left-Extraocular movements intact, No Nystagmus. Eyeball - Right-Extraocular movements intact, No Nystagmus. Cornea - Left-No Hazy. Cornea - Right-No Hazy. Sclera/Conjunctiva - Left-No scleral icterus, No Discharge. Sclera/Conjunctiva - Right-No scleral icterus, No Discharge. Pupil - Left-Direct reaction to light normal. Pupil - Right-Direct reaction to light normal.  ENMT Ears Pinna - Left - no drainage observed, no generalized tenderness observed. Right - no drainage observed, no generalized tenderness observed. Nose and Sinuses External Inspection of the Nose - no destructive lesion observed. Inspection of the nares - Left - quiet respiration. Right - quiet respiration. Mouth and Throat Lips - Upper Lip - no fissures observed, no pallor noted. Lower Lip - no fissures observed, no pallor noted. Nasopharynx - no discharge present. Oral Cavity/Oropharynx - Tongue - no dryness observed. Oral Mucosa - no cyanosis observed. Hypopharynx - no evidence of airway distress observed.  Chest and Lung Exam Inspection Movements - Normal and Symmetrical. Accessory muscles - No use of accessory muscles in breathing. Palpation Palpation of the chest reveals - Non-tender. Auscultation Breath sounds - Normal and Clear.  Cardiovascular Auscultation Rhythm - Regular. Murmurs & Other Heart Sounds - Auscultation of the heart reveals - No Murmurs and No Systolic Clicks.  Abdomen Inspection Inspection of the abdomen reveals - No Visible peristalsis  and No Abnormal pulsations. Umbilicus - No Bleeding, No Urine drainage. Palpation/Percussion Palpation and Percussion of the abdomen reveal - Soft, Non Tender, No Rebound tenderness, No Rigidity (guarding) and No Cutaneous hyperesthesia. Note: Abdomen soft and flat. Large midline incision. No hernias. No umbilical hernia. No diastases. Nontender. Nondistended.  Male Genitourinary Sexual Maturity Tanner 5 - Adult hair pattern and Adult penile size and shape. Note: Normal external male genitalia. Obvious moderate size right internal hernia to mid scrotum. Very sensitive. Ultimately reducible. Sensitive and left groin with subtle impulse suspicious for small left inguinal hernia as well. Testicles sensitive but no discrete masses.  Peripheral Vascular Upper Extremity Inspection - Left - No Cyanotic nailbeds, Not Ischemic. Right - No Cyanotic nailbeds, Not Ischemic.  Neurologic Neurologic evaluation reveals -normal attention span and ability to concentrate, able to name objects and repeat phrases. Appropriate fund of knowledge , normal sensation and normal coordination. Mental Status Affect - not angry, not paranoid. Cranial Nerves-Normal Bilaterally. Gait-Normal.  Neuropsychiatric Mental status exam performed with findings of-able to articulate well with normal speech/language, rate, volume and coherence, thought content normal with ability to perform basic computations and apply abstract reasoning and no evidence of hallucinations, delusions, obsessions or homicidal/suicidal ideation.  Musculoskeletal Global Assessment Spine, Ribs and Pelvis - no instability, subluxation or laxity. Right Upper Extremity - no instability, subluxation or laxity.  Lymphatic Head & Neck  General Head & Neck Lymphatics: Bilateral - Description - No Localized lymphadenopathy. Axillary  General Axillary Region: Bilateral - Description - No Localized lymphadenopathy. Femoral &  Inguinal  Generalized Femoral & Inguinal Lymphatics: Left - Description - No Localized lymphadenopathy. Right - Description - No Localized lymphadenopathy.    Assessment & Plan Adin Hector MD; 07/29/2015 10:08 AM)  BILATERAL INGUINAL HERNIA WITHOUT OBSTRUCTION OR GANGRENE, RECURRENCE NOT SPECIFIED (K40.20) Impression: Enlarging and very sensitive right inguinal hernia. Smaller but sensitive left inguinal hernia as well.  I think he would benefit from surgical repair. Dry laps Or preperitoneal approach. Palpation surgery. I  think he cannot wait much longer.  In the meantime, ice/sheet, anti-inflammatories. We'll give higher dose naproxen. Can give a few more narcotics as needed. Do not want to give them to them indefinitely. He really wants to get back to work but cannot function. We'll try and do this urgently.  Current Plans Started Naproxen 500MG , 1 (one) Tablet two times daily, #30, 07/29/2015, Ref. x1. Started TraMADol HCl 50MG , 1-2 Tablet every six hours, as needed, #40, 07/29/2015, No Refill. ENCOUNTER FOR PRE-OP - GROIN HERNIA (Z01.818)  Current Plans You are being scheduled for surgery - Our schedulers will call you.  You should hear from our office's scheduling department within 5 working days about the location, date, and time of surgery. We try to make accommodations for patient's preferences in scheduling surgery, but sometimes the OR schedule or the surgeon's schedule prevents [place] from making those accommodations.  If you have not heard from our office [phone]) in 5 working days, call the office and ask for your surgeon's nurse.  If you have other questions about your diagnosis, plan, or surgery, call the office and ask for your surgeon's nurse.  Pt Education - Pamphlet Given - Laparoscopic Hernia Repair: discussed with patient and provided information. The anatomy & physiology of the abdominal wall and pelvic floor was discussed. The pathophysiology of hernias in  the inguinal and pelvic region was discussed. Natural history risks such as progressive enlargement, pain, incarceration, and strangulation was discussed. Contributors to complications such as smoking, obesity, diabetes, prior surgery, etc were discussed.  I feel the risks of no intervention will lead to serious problems that outweigh the operative risks; therefore, I recommended surgery to reduce and repair the hernia. I explained laparoscopic techniques with possible need for an open approach. I noted usual use of mesh to patch and/or buttress hernia repair  Risks such as bleeding, infection, abscess, need for further treatment, heart attack, death, and other risks were discussed. I noted a good likelihood this will help address the problem. Goals of post-operative recovery were discussed as well. Possibility that this will not correct all symptoms was explained. I stressed the importance of low-impact activity, aggressive pain control, avoiding constipation, & not pushing through pain to minimize risk of post-operative chronic pain or injury. Possibility of reherniation was discussed. We will work to minimize complications.  An educational handout further explaining the pathology & treatment options was given as well. Questions were answered. The patient expresses understanding & wishes to proceed with surgery.  Pt Education - [facility] Hernia Post-Op HCI (Shantia Sanford): discussed with patient and provided information.  Adin Hector, M.D., F.A.C.S. Gastrointestinal and Minimally Invasive Surgery [facility] [address] [place], [place] [address] [phone] Main / Paging

## 2015-08-06 NOTE — Discharge Instructions (Signed)
HERNIA REPAIR: POST OP INSTRUCTIONS ° °1. DIET: Follow a light bland diet the first 24 hours after arrival home, such as soup, liquids, crackers, etc.  Be sure to include lots of fluids daily.  Avoid fast food or heavy meals as your are more likely to get nauseated.  Eat a low fat the next few days after surgery. °2. Take your usually prescribed home medications unless otherwise directed. °3. PAIN CONTROL: °a. Pain is best controlled by a usual combination of three different methods TOGETHER: °i. Ice/Heat °ii. Over the counter pain medication °iii. Prescription pain medication °b. Most patients will experience some swelling and bruising around the hernia(s) such as the bellybutton, groins, or old incisions.  Ice packs or heating pads (30-60 minutes up to 6 times a day) will help. Use ice for the first few days to help decrease swelling and bruising, then switch to heat to help relax tight/sore spots and speed recovery.  Some people prefer to use ice alone, heat alone, alternating between ice & heat.  Experiment to what works for you.  Swelling and bruising can take several weeks to resolve.   °c. It is helpful to take an over-the-counter pain medication regularly for the first few weeks.  Choose one of the following that works best for you: °i. Naproxen (Aleve, etc)  Two 220mg tabs twice a day °ii. Ibuprofen (Advil, etc) Three 200mg tabs four times a day (every meal & bedtime) °iii. Acetaminophen (Tylenol, etc) 325-650mg four times a day (every meal & bedtime) °d. A  prescription for pain medication should be given to you upon discharge.  Take your pain medication as prescribed.  °i. If you are having problems/concerns with the prescription medicine (does not control pain, nausea, vomiting, rash, itching, etc), please call us [phone] to see if we need to switch you to a different pain medicine that will work better for you and/or control your side effect better. °ii. If you need a refill on your pain  medication, please contact your pharmacy.  They will contact our office to request authorization. Prescriptions will not be filled after 5 pm or on week-ends. °4. Avoid getting constipated.  Between the surgery and the pain medications, it is common to experience some constipation.  Increasing fluid intake and taking a fiber supplement (such as Metamucil, Citrucel, FiberCon, MiraLax, etc) 1-2 times a day regularly will usually help prevent this problem from occurring.  A mild laxative (prune juice, Milk of Magnesia, MiraLax, etc) should be taken according to package directions if there are no bowel movements after 48 hours.   °5. Wash / shower every day.  You may shower over the dressings as they are waterproof.   °6. Remove your waterproof bandages 5 days after surgery.  You may leave the incision open to air.  You may replace a dressing/Band-Aid to cover the incision for comfort if you wish.  Continue to shower over incision(s) after the dressing is off. ° ° ° °7. ACTIVITIES as tolerated:   °a. You may resume regular (light) daily activities beginning the next day--such as daily self-care, walking, climbing stairs--gradually increasing activities as tolerated.  If you can walk 30 minutes without difficulty, it is safe to try more intense activity such as jogging, treadmill, bicycling, low-impact aerobics, swimming, etc. °b. Save the most intensive and strenuous activity for last such as sit-ups, heavy lifting, contact sports, etc  Refrain from any heavy lifting or straining until you are off narcotics for pain control.   °  c. DO NOT PUSH THROUGH PAIN.  Let pain be your guide: If it hurts to do something, don't do it.  Pain is your body warning you to avoid that activity for another week until the pain goes down. d. You may drive when you are no longer taking prescription pain medication, you can comfortably wear a seatbelt, and you can safely maneuver your car and apply brakes. e. Dennis Bast may have sexual intercourse  when it is comfortable.  8. FOLLOW UP in our office a. Please call [facility] at [phone] to set up an appointment to see your surgeon in the office for a follow-up appointment approximately 2-3 weeks after your surgery. b. Make sure that you call for this appointment the day you arrive home to insure a convenient appointment time. 9.  IF YOU HAVE DISABILITY OR FAMILY LEAVE FORMS, BRING THEM TO THE OFFICE FOR PROCESSING.  DO NOT GIVE THEM TO YOUR DOCTOR.  WHEN TO CALL us [phone]: 1. Poor pain control 2. Reactions / problems with new medications (rash/itching, nausea, etc)  3. Fever over 101.5 F (38.5 C) 4. Inability to urinate 5. Nausea and/or vomiting 6. Worsening swelling or bruising 7. Continued bleeding from incision. 8. Increased pain, redness, or drainage from the incision   The clinic staff is available to answer your questions during regular business hours (8:30am-5pm).  Please dont hesitate to call and ask to speak to one of our nurses for clinical concerns.   If you have a medical emergency, go to the nearest emergency room or call 911.  A surgeon from [facility] is always on call at the hospitals in [facility], [place], [place], [place], [place]  [address] ?  [address] [address], [place], [place]   [address] MAIN: [phone] ? TOLL FREE: [phone] ? FAX: [phone] [email or web address]  GETTING TO GOOD BOWEL HEALTH. Irregular bowel habits such as constipation and diarrhea can lead to many problems over time.  Having one soft bowel movement a day is the most important way to prevent further problems.  The anorectal canal is designed to handle stretching and feces to safely manage our ability to get rid of solid waste (feces, poop, stool) out of our body.  BUT, hard constipated stools can act like ripping concrete bricks and diarrhea can be a burning fire to this very sensitive area of our body, causing  inflamed hemorrhoids, anal fissures, increasing risk is perirectal abscesses, abdominal pain/bloating, an making irritable bowel worse.      The goal: ONE SOFT BOWEL MOVEMENT A DAY!  To have soft, regular bowel movements:   Drink plenty of fluids, consider 4-6 tall glasses of water a day.    Take plenty of fiber.  Fiber is the undigested part of plant food that passes into the colon, acting s natures broom to encourage bowel motility and movement.  Fiber can absorb and hold large amounts of water. This results in a larger, bulkier stool, which is soft and easier to pass. Work gradually over several weeks up to 6 servings a day of fiber (25g a day even more if needed) in the form of: o Vegetables -- Root (potatoes, carrots, turnips), leafy green (lettuce, salad greens, celery, spinach), or cooked high residue (cabbage, broccoli, etc) o Fruit -- Fresh (unpeeled skin & pulp), Dried (prunes, apricots, cherries, etc ),  or stewed ( applesauce)  o Whole grain breads, pasta, etc (whole wheat)  o Bran cereals  Bulking Agents -- This type of water-retaining fiber generally is easily obtained each day by one of the following:  o Psyllium bran -- The psyllium plant is remarkable because its ground seeds can retain so much water. This product is available as Metamucil, Konsyl, Effersyllium, Per Diem Fiber, or the less expensive generic preparation in drug and health food stores. Although labeled a laxative, it really is not a laxative.  o Methylcellulose -- This is another fiber derived from wood which also retains water. It is available as Citrucel. o Polyethylene Glycol - and artificial fiber commonly called Miralax or Glycolax.  It is helpful for people with gassy or bloated feelings with regular fiber o Flax Seed - a less gassy fiber than psyllium  No reading or other relaxing activity while on the toilet. If bowel movements take longer than 5 minutes, you are too constipated  AVOID CONSTIPATION.   High fiber and water intake usually takes care of this.  Sometimes a laxative is needed to stimulate more frequent bowel movements, but   Laxatives are not a good long-term solution as it can wear the colon out.  They can help jump-start bowels if constipated, but should be relied on constantly without discussing with your doctor o Osmotics (Milk of Magnesia, Fleets phosphosoda, Magnesium citrate, MiraLax, GoLytely) are safer than  o Stimulants (Senokot, Castor Oil, Dulcolax, Ex Lax)    o Avoid taking laxatives for more than 7 days in a row.   IF SEVERELY CONSTIPATED, try a Bowel Retraining Program: o Do not use laxatives.  o Eat a diet high in roughage, such as bran cereals and leafy vegetables.  o Drink six (6) ounces of prune or apricot juice each morning.  o Eat two (2) large servings of stewed fruit each day.  o Take one (1) heaping tablespoon of a psyllium-based bulking agent twice a day. Use sugar-free sweetener when possible to avoid excessive calories.  o Eat a normal breakfast.  o Set aside 15 minutes after breakfast to sit on the toilet, but do not strain to have a bowel movement.  o If you do not have a bowel movement by the third day, use an enema and repeat the above steps.   Controlling diarrhea o Switch to liquids and simpler foods for a few days to avoid stressing your intestines further. o Avoid dairy products (especially milk & ice cream) for a short time.  The intestines often can lose the ability to digest lactose when stressed. o Avoid foods that cause gassiness or bloating.  Typical foods include beans and other legumes, cabbage, broccoli, and dairy foods.  Every person has some sensitivity to other foods, so listen to our body and avoid those foods that trigger problems for you. o Adding fiber (Citrucel, Metamucil, psyllium, Miralax) gradually can help thicken stools by absorbing excess fluid and retrain the intestines to act more normally.  Slowly increase the dose over  a few weeks.  Too much fiber too soon can backfire and cause cramping & bloating. o Probiotics (such as active yogurt, Align, etc) may help repopulate the intestines and colon with normal bacteria and calm down a sensitive digestive tract.  Most studies show it to be of mild help, though, and such products can be costly. o Medicines: - Bismuth subsalicylate (ex. Kayopectate, Pepto Bismol) every 30 minutes for up to 6 doses can help control diarrhea.  Avoid if pregnant. - Loperamide (Immodium) can slow down diarrhea.  Start with two tablets (23m total) first  and then try one tablet every 6 hours.  Avoid if you are having fevers or severe pain.  If you are not better or start feeling worse, stop all medicines and call your doctor for advice o Call your doctor if you are getting worse or not better.  Sometimes further testing (cultures, endoscopy, X-ray studies, bloodwork, etc) may be needed to help diagnose and treat the cause of the diarrhea.  TROUBLESHOOTING IRREGULAR BOWELS 1) Avoid extremes of bowel movements (no bad constipation/diarrhea) 2) Miralax 17gm mixed in 8oz. water or juice-daily. May use BID as needed.  3) Gas-x,Phazyme, etc. as needed for gas & bloating.  4) Soft,bland diet. No spicy,greasy,fried foods.  5) Prilosec over-the-counter as needed  6) May hold gluten/wheat products from diet to see if symptoms improve.  7)  May try probiotics (Align, Activa, etc) to help calm the bowels down 7) If symptoms become worse call back immediately.  Managing Pain  Pain after surgery or related to activity is often due to strain/injury to muscle, tendon, nerves and/or incisions.  This pain is usually short-term and will improve in a few months.   Many people find it helpful to do the following things TOGETHER to help speed the process of healing and to get back to regular activity more quickly:  1. Avoid heavy physical activity at first a. No lifting greater than 20 pounds at first, then  increase to lifting as tolerated over the next few weeks b. Do not push through the pain.  Listen to your body and avoid positions and maneuvers than reproduce the pain.  Wait a few days before trying something more intense c. Walking is okay as tolerated, but go slowly and stop when getting sore.  If you can walk 30 minutes without stopping or pain, you can try more intense activity (running, jogging, aerobics, cycling, swimming, treadmill, sex, sports, weightlifting, etc ) d. Remember: If it hurts to do it, then dont do it!  2. Take Anti-inflammatory medication a. Choose ONE of the following over-the-counter medications: i.            Acetaminophen 500mg  tabs (Tylenol) 1-2 pills with every meal and just before bedtime (avoid if you have liver problems) ii.            Naproxen 220mg  tabs (ex. Aleve) 1-2 pills twice a day (avoid if you have kidney, stomach, IBD, or bleeding problems) iii. Ibuprofen 200mg  tabs (ex. Advil, Motrin) 3-4 pills with every meal and just before bedtime (avoid if you have kidney, stomach, IBD, or bleeding problems) b. Take with food/snack around the clock for 1-2 weeks i. This helps the muscle and nerve tissues become less irritable and calm down faster  3. Use a Heating pad or Ice/Cold Pack a. 4-6 times a day b. May use warm bath/hottub  or showers  4. Try Gentle Massage and/or Stretching  a. at the area of pain many times a day b. stop if you feel pain - do not overdo it  Try these steps together to help you body heal faster and avoid making things get worse.  Doing just one of these things may not be enough.    If you are not getting better after two weeks or are noticing you are getting worse, contact our office for further advice; we may need to re-evaluate you & see what other things we can do to help.  Inguinal Hernia, Adult Muscles help keep everything in the body in its proper place. But if  a weak spot in the muscles develops, something can poke through.  That is called a hernia. When this happens in the lower part of the belly (abdomen), it is called an inguinal hernia. (It takes its name from a part of the body in this region called the inguinal canal.) A weak spot in the wall of muscles lets some fat or part of the small intestine bulge through. An inguinal hernia can develop at any age. Men get them more often than women. CAUSES  In adults, an inguinal hernia develops over time.  It can be triggered by:  Suddenly straining the muscles of the lower abdomen.  Lifting heavy objects.  Straining to have a bowel movement. Difficult bowel movements (constipation) can lead to this.  Constant coughing. This may be caused by smoking or lung disease.  Being overweight.  Being pregnant.  Working at a job that requires long periods of standing or heavy lifting.  Having had an inguinal hernia before. One type can be an emergency situation. It is called a strangulated inguinal hernia. It develops if part of the small intestine slips through the weak spot and cannot get back into the abdomen. The blood supply can be cut off. If that happens, part of the intestine may die. This situation requires emergency surgery. SYMPTOMS  Often, a small inguinal hernia has no symptoms. It is found when a healthcare provider does a physical exam. Larger hernias usually have symptoms.   In adults, symptoms may include:  A lump in the groin. This is easier to see when the person is standing. It might disappear when lying down.  In men, a lump in the scrotum.  Pain or burning in the groin. This occurs especially when lifting, straining or coughing.  A dull ache or feeling of pressure in the groin.  Signs of a strangulated hernia can include:  A bulge in the groin that becomes very painful and tender to the touch.  A bulge that turns red or purple.  Fever, nausea and vomiting.  Inability to have a bowel movement or to pass gas. DIAGNOSIS  To decide if  you have an inguinal hernia, a healthcare provider will probably do a physical examination.  This will include asking questions about any symptoms you have noticed.  The healthcare provider might feel the groin area and ask you to cough. If an inguinal hernia is felt, the healthcare provider may try to slide it back into the abdomen.  Usually no other tests are needed. TREATMENT  Treatments can vary. The size of the hernia makes a difference. Options include:  Watchful waiting. This is often suggested if the hernia is small and you have had no symptoms.  No medical procedure will be done unless symptoms develop.  You will need to watch closely for symptoms. If any occur, contact your healthcare provider right away.  Surgery. This is used if the hernia is larger or you have symptoms.  Open surgery. This is usually an outpatient procedure (you will not stay overnight in a hospital). An cut (incision) is made through the skin in the groin. The hernia is put back inside the abdomen. The weak area in the muscles is then repaired by herniorrhaphy or hernioplasty. Herniorrhaphy: in this type of surgery, the weak muscles are sewn back together. Hernioplasty: a patch or mesh is used to close the weak area in the abdominal wall.  Laparoscopy. In this procedure, a surgeon makes small incisions. A thin tube with a tiny video  camera (called a laparoscope) is put into the abdomen. The surgeon repairs the hernia with mesh by looking with the video camera and using two long instruments. HOME CARE INSTRUCTIONS   After surgery to repair an inguinal hernia:  You will need to take pain medicine prescribed by your healthcare provider. Follow all directions carefully.  You will need to take care of the wound from the incision.  Your activity will be restricted for awhile. This will probably include no heavy lifting for several weeks. You also should not do anything too active for a few weeks. When you can  return to work will depend on the type of job that you have.  During "watchful waiting" periods, you should:  Maintain a healthy weight.  Eat a diet high in fiber (fruits, vegetables and whole grains).  Drink plenty of fluids to avoid constipation. This means drinking enough water and other liquids to keep your urine clear or pale yellow.  Do not lift heavy objects.  Do not stand for long periods of time.  Quit smoking. This should keep you from developing a frequent cough. SEEK MEDICAL CARE IF:   A bulge develops in your groin area.  You feel pain, a burning sensation or pressure in the groin. This might be worse if you are lifting or straining.  You develop a fever of more than 100.5 F (38.1 C). SEEK IMMEDIATE MEDICAL CARE IF:   Pain in the groin increases suddenly.  A bulge in the groin gets bigger suddenly and does not go down.  For men, there is sudden pain in the scrotum. Or, the size of the scrotum increases.  A bulge in the groin area becomes red or purple and is painful to touch.  You have nausea or vomiting that does not go away.  You feel your heart beating much faster than normal.  You cannot have a bowel movement or pass gas.  You develop a fever of more than 102.0 F (38.9 C).   This information is not intended to replace advice given to you by your health care provider. Make sure you discuss any questions you have with your health care provider.   Document Released: 10/10/2008 Document Revised: 08/16/2011 Document Reviewed: 11/25/2014 Elsevier Interactive Patient Education [employer].

## 2015-08-06 NOTE — Anesthesia Procedure Notes (Signed)
Procedure Name: Intubation Date/Time: 08/06/2015 11:06 AM Performed by: Freddie Breech Pre-anesthesia Checklist: Patient identified, Emergency Drugs available, Suction available, Patient being monitored and Timeout performed Patient Re-evaluated:Patient Re-evaluated prior to inductionOxygen Delivery Method: Circle system utilized Preoxygenation: Pre-oxygenation with 100% oxygen Intubation Type: IV induction Ventilation: Mask ventilation without difficulty and Oral airway inserted - appropriate to patient size Laryngoscope Size: Mac and 4 Grade View: Grade II Tube size: 7.0 mm Number of attempts: 1 Airway Equipment and Method: Patient positioned with wedge pillow and Stylet Placement Confirmation: ETT inserted through vocal cords under direct vision,  positive ETCO2,  CO2 detector and breath sounds checked- equal and bilateral Secured at: 22 cm Tube secured with: Tape Dental Injury: Teeth and Oropharynx as per pre-operative assessment

## 2015-08-07 ENCOUNTER — Encounter (HOSPITAL_COMMUNITY): Payer: Self-pay | Admitting: Surgery

## 2015-08-11 MED FILL — traMADol HCL 50 MG TABS: 50 | 5 days supply | Qty: 40 | Fill #0

## 2015-09-17 IMAGING — DX DG ANKLE COMPLETE 3+V*L*
3 series · 3 of 3 positions shown · non-contrast
Comparison: None.

CLINICAL DATA: Acute left ankle swelling after rolling injury on
trail. Initial encounter.

EXAM:
LEFT ANKLE COMPLETE - 3+ VIEW

[ankle ap]
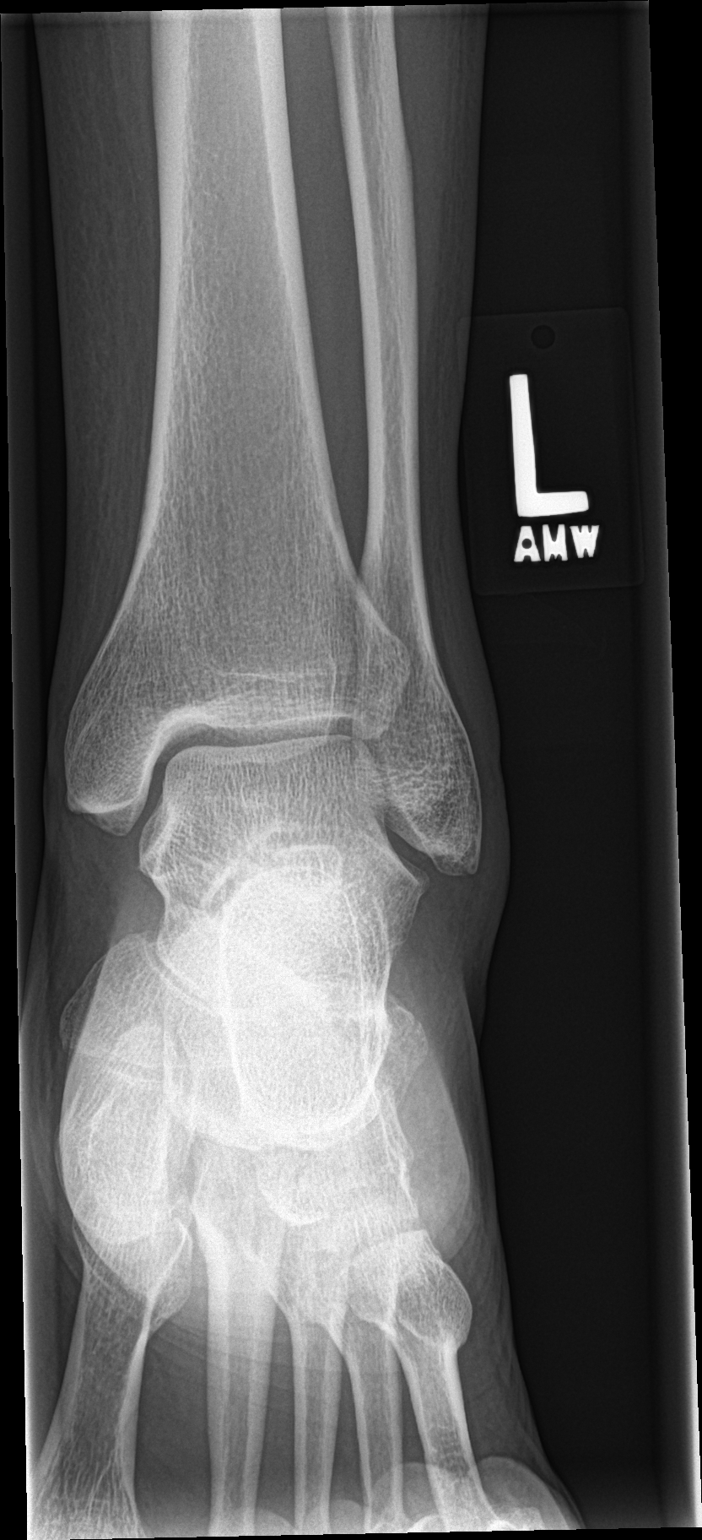

[ankle obl]
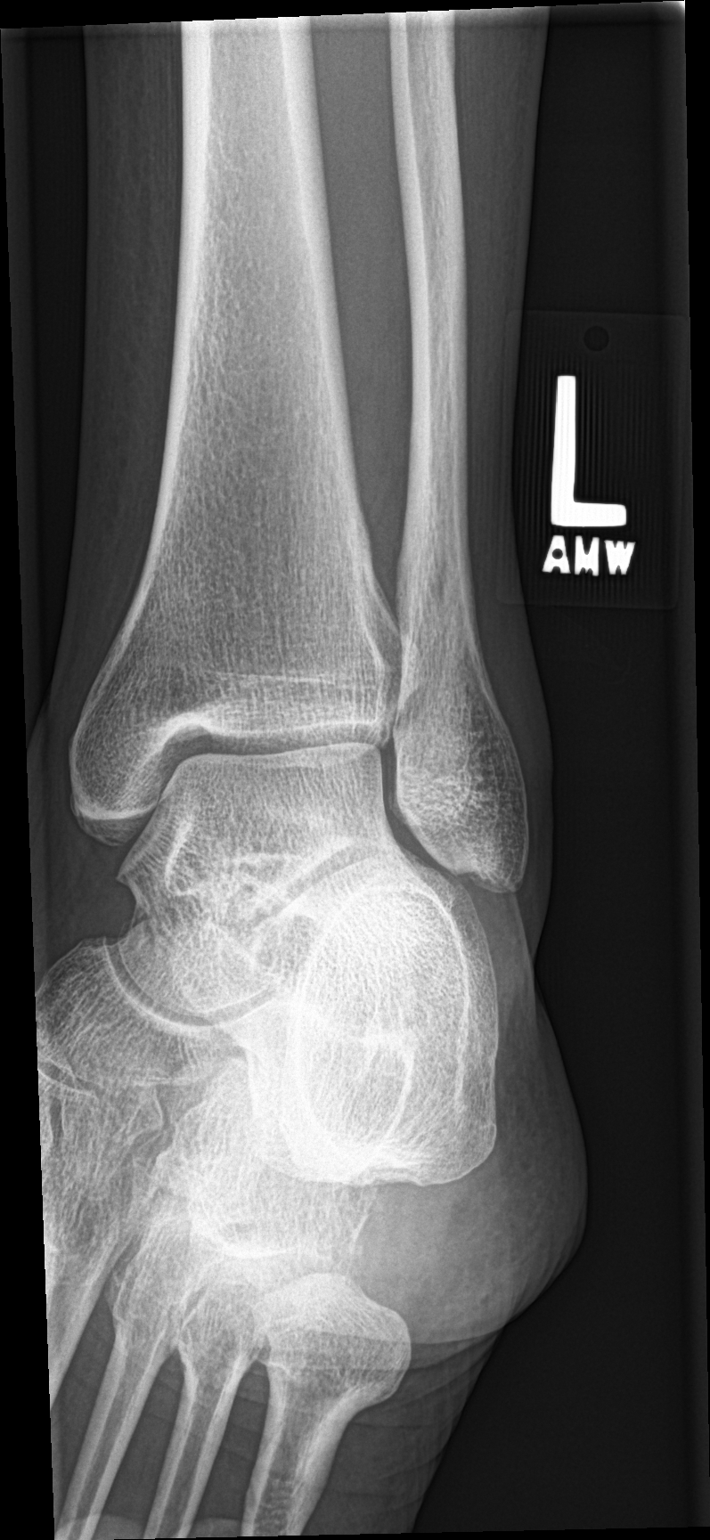

[ankle lat]
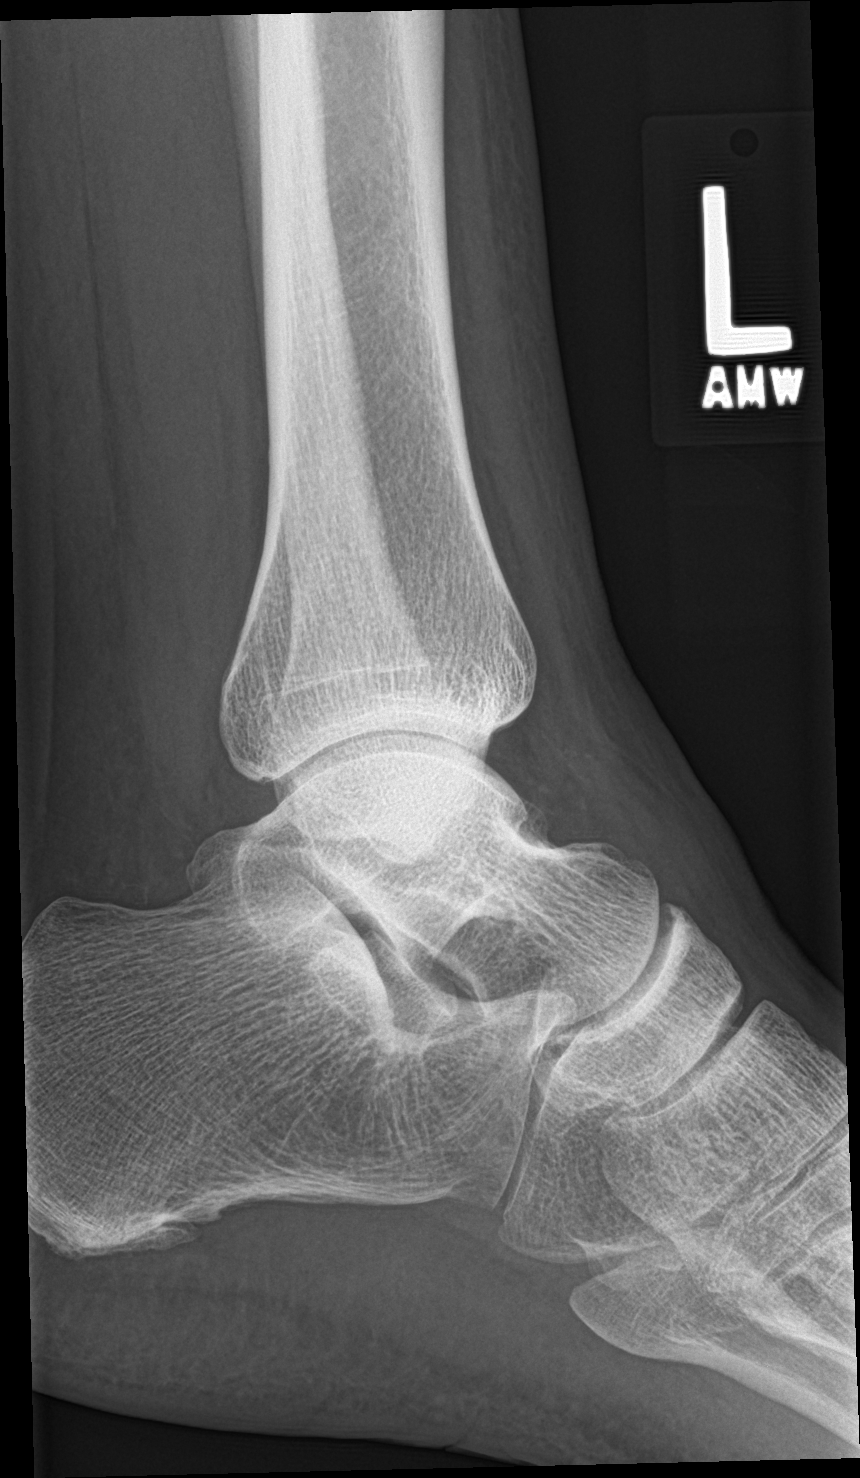

[3 of 3 positions shown; findings below may reference images not displayed]

FINDINGS: There is no evidence of fracture, dislocation, or joint effusion.
There is no evidence of arthropathy or other focal bone abnormality.
Soft tissues are unremarkable.
IMPRESSION: Normal left ankle.

## 2016-08-17 ENCOUNTER — Encounter (HOSPITAL_COMMUNITY): Payer: Self-pay | Admitting: Family Medicine

## 2016-08-17 ENCOUNTER — Ambulatory Visit (HOSPITAL_COMMUNITY)
Admission: EM | Admit: 2016-08-17 | Discharge: 2016-08-17 | Disposition: A | Discharge status: Home / Self Care | Payer: No Typology Code available for payment source | Attending: Internal Medicine | Admitting: Internal Medicine

## 2016-08-17 DIAGNOSIS — M62830 Muscle spasm of back: Secondary | ICD-10-CM

## 2016-08-17 MED ORDER — DICLOFENAC SODIUM 75 MG PO TBEC: 75.0000 mg | DELAYED_RELEASE_TABLET | Freq: Two times a day (BID) | ORAL | 0 refills | Status: AC

## 2016-08-17 MED ORDER — CYCLOBENZAPRINE HCL 10 MG PO TABS: 10.0000 mg | ORAL_TABLET | Freq: Two times a day (BID) | ORAL | 0 refills | Status: AC | PRN

## 2016-08-17 NOTE — ED Triage Notes (Signed)
Pt here for lower back pain since Sunday night. sts that he bent over to pick something up and felt pain.

## 2016-08-17 NOTE — Discharge Instructions (Addendum)
You most likely have a strained muscle in your lower back. I have prescribed two medicines for your pain. The first is diclofenac, take 1 tablet twice a day and the other is Flexeril, take 1 tablet twice a day. Flexeril may cause drowsiness so do not drive until you know how this medicine affects you. Also do not drink any alcohol either. You may apply ice and alternate with heat for 15 minutes at a time 4 times daily and for additional pain control you may take tylenol over the counter ever 4 hours but do not take more than 4000 mg a day. Should your pain continue or fail to resolve, follow up with your primary care provider or return to clinic as needed.

## 2016-08-17 NOTE — ED Provider Notes (Signed)
CSN: [id number]     Arrival date & time 08/17/16  90 History   First MD Initiated Contact with Patient 08/17/16 1711     Chief Complaint  Patient presents with  . Back Pain   (Consider location/radiation/quality/duration/timing/severity/associated sxs/prior Treatment) 56 year old male resents to clinic with a chief complaint of lower back pain for approximately 2-3 days. States he was lifting a heavy box at his place of employment when he felt a "snap" in his lower back. He has had no loss of sensation or function distal to his injury, he has not lost control of either his bowel or his bladder, he has had no fever nausea or other markers of systemic illness. He has no gait disturbances, no difficulty walking. He describes his pain as being a cramp-like to sharp stabbing like pain that is non-radiating in his lower back.   The history is provided by the patient.    Past Medical History:  Diagnosis Date  . History of nephrectomy    left, for donation   Past Surgical History:  Procedure Laterality Date  . INGUINAL HERNIA REPAIR Bilateral 08/06/2015   Procedure: LAPAROSCOPIC BILATERAL INGUINAL HERNIA REPAIR;  Surgeon: Michael Boston, MD;  Location: [facility];  Service: General;  Laterality: Bilateral;  . INSERTION OF MESH Bilateral 08/06/2015   Procedure: INSERTION OF MESH;  Surgeon: Michael Boston, MD;  Location: [facility];  Service: General;  Laterality: Bilateral;  . NEPHRECTOMY LIVING DONOR  1995  . SURGERY SCROTAL / TESTICULAR  2004   fatty mass removed    Family History  Problem Relation Age of Onset  . Asthma Mother    Social History  Substance Use Topics  . Smoking status: Former Research scientist (life sciences)  . Smokeless tobacco: Never Used  . Alcohol use Yes     Comment: occ moderate    Review of Systems  Reason unable to perform ROS: As covered in history of present illness.  All other systems reviewed and are negative.   Allergies  Patient has no known allergies.  Home Medications   Prior to  Admission medications   Medication Sig Start Date End Date Taking? Authorizing Provider  cyclobenzaprine (FLEXERIL) 10 MG tablet Take 1 tablet (10 mg total) by mouth 2 (two) times daily as needed for muscle spasms. 08/17/16   Barnet Glasgow, NP  diclofenac (VOLTAREN) 75 MG EC tablet Take 1 tablet (75 mg total) by mouth 2 (two) times daily. 08/17/16   Barnet Glasgow, NP   Meds Ordered and Administered this Visit  Medications - No data to display  BP 138/75   Pulse 68   Temp 98.3 F (36.8 C)   Resp 18   SpO2 97%  No data found.   Physical Exam  Constitutional: He is oriented to person, place, and time. He appears well-developed and well-nourished. No distress.  HENT:  Head: Normocephalic and atraumatic.  Right Ear: External ear normal.  Left Ear: External ear normal.  Eyes: EOM are normal. Pupils are equal, round, and reactive to light.  Neck: Normal range of motion. Neck supple. No JVD present.  Cardiovascular: Normal rate and regular rhythm.   Pulmonary/Chest: Effort normal and breath sounds normal.  Musculoskeletal:       Lumbar back: He exhibits tenderness and pain. He exhibits normal range of motion, no bony tenderness, no swelling, no edema and no spasm.       Back:  Lymphadenopathy:    He has no cervical adenopathy.  Neurological: He is alert and oriented to person,  place, and time.  Skin: Skin is warm and dry. Capillary refill takes less than 2 seconds. He is not diaphoretic.  Psychiatric: He has a normal mood and affect. His behavior is normal.  Nursing note and vitals reviewed.   Urgent Care Course     Procedures (including critical care time)  Labs Review Labs Reviewed - No data to display  Imaging Review No results found.      MDM   1. Muscle spasm of back    You most likely have a strained muscle in your lower back. I have prescribed two medicines for your pain. The first is diclofenac, take 1 tablet twice a day and the other is Flexeril, take 1  tablet twice a day. Flexeril may cause drowsiness so do not drive until you know how this medicine affects you. Also do not drink any alcohol either. You may apply ice and alternate with heat for 15 minutes at a time 4 times daily and for additional pain control you may take tylenol over the counter ever 4 hours but do not take more than 4000 mg a day. Should your pain continue or fail to resolve, follow up with your primary care provider or return to clinic as needed.      Barnet Glasgow, NP 08/17/16 1756

## 2016-09-20 ENCOUNTER — Ambulatory Visit (HOSPITAL_COMMUNITY)
Admission: EM | Admit: 2016-09-20 | Discharge: 2016-09-20 | Disposition: A | Discharge status: Home / Self Care | Payer: No Typology Code available for payment source | Attending: Internal Medicine | Admitting: Internal Medicine

## 2016-09-20 ENCOUNTER — Encounter (HOSPITAL_COMMUNITY): Payer: Self-pay | Admitting: Emergency Medicine

## 2016-09-20 DIAGNOSIS — R21 Rash and other nonspecific skin eruption: Secondary | ICD-10-CM

## 2016-09-20 DIAGNOSIS — L2389 Allergic contact dermatitis due to other agents: Secondary | ICD-10-CM

## 2016-09-20 MED ORDER — METHYLPREDNISOLONE 4 MG PO TBPK: ORAL_TABLET | ORAL | 0 refills | Status: AC

## 2016-09-20 MED ORDER — HYDROXYZINE HCL 25 MG PO TABS: 25.0000 mg | ORAL_TABLET | Freq: Four times a day (QID) | ORAL | 0 refills | Status: AC

## 2016-09-20 NOTE — ED Provider Notes (Signed)
CSN: [id number]     Arrival date & time 09/20/16  1118 History   None    Chief Complaint  Patient presents with  . Rash   (Consider location/radiation/quality/duration/timing/severity/associated sxs/prior Treatment) Patient c/o rash on abdomen, back, and arms starting 2 days ago.   The history is provided by the patient.  Rash  Location:  Full body Quality: itchiness and redness   Severity:  Moderate Onset quality:  Sudden Duration:  2 days Timing:  Constant Progression:  Worsening Chronicity:  New Relieved by:  Nothing Worsened by:  Nothing Ineffective treatments:  None tried   Past Medical History:  Diagnosis Date  . History of nephrectomy    left, for donation   Past Surgical History:  Procedure Laterality Date  . INGUINAL HERNIA REPAIR Bilateral 08/06/2015   Procedure: LAPAROSCOPIC BILATERAL INGUINAL HERNIA REPAIR;  Surgeon: Michael Boston, MD;  Location: [facility];  Service: General;  Laterality: Bilateral;  . INSERTION OF MESH Bilateral 08/06/2015   Procedure: INSERTION OF MESH;  Surgeon: Michael Boston, MD;  Location: [facility];  Service: General;  Laterality: Bilateral;  . NEPHRECTOMY LIVING DONOR  1995  . SURGERY SCROTAL / TESTICULAR  2004   fatty mass removed    Family History  Problem Relation Age of Onset  . Asthma Mother    Social History  Substance Use Topics  . Smoking status: Former Research scientist (life sciences)  . Smokeless tobacco: Never Used  . Alcohol use Yes     Comment: occ moderate    Review of Systems  Constitutional: Negative.   HENT: Negative.   Eyes: Negative.   Respiratory: Negative.   Cardiovascular: Negative.   Gastrointestinal: Negative.   Endocrine: Negative.   Genitourinary: Negative.   Musculoskeletal: Negative.   Skin: Positive for rash.  Allergic/Immunologic: Negative.   Neurological: Negative.   Hematological: Negative.   Psychiatric/Behavioral: Negative.     Allergies  Patient has no known allergies.  Home Medications   Prior to Admission  medications   Medication Sig Start Date End Date Taking? Authorizing Provider  cyclobenzaprine (FLEXERIL) 10 MG tablet Take 1 tablet (10 mg total) by mouth 2 (two) times daily as needed for muscle spasms. 08/17/16   Barnet Glasgow, NP  diclofenac (VOLTAREN) 75 MG EC tablet Take 1 tablet (75 mg total) by mouth 2 (two) times daily. 08/17/16   Barnet Glasgow, NP  hydrOXYzine (ATARAX/VISTARIL) 25 MG tablet Take 1 tablet (25 mg total) by mouth every 6 (six) hours. 09/20/16   Lysbeth Penner, FNP  methylPREDNISolone (MEDROL DOSEPAK) 4 MG TBPK tablet Take 6-5-4-3-2-1 po qd 09/20/16   Lysbeth Penner, FNP   Meds Ordered and Administered this Visit  Medications - No data to display  BP 136/76 (BP Location: Right Arm)   Pulse 74   Temp 98 F (36.7 C) (Oral)   Resp 18   SpO2 96%  No data found.   Physical Exam  Constitutional: He is oriented to person, place, and time. He appears well-developed and well-nourished.  HENT:  Head: Normocephalic and atraumatic.  Eyes: Conjunctivae and EOM are normal. Pupils are equal, round, and reactive to light.  Neck: Normal range of motion. Neck supple.  Cardiovascular: Normal rate, regular rhythm and normal heart sounds.   Pulmonary/Chest: Effort normal and breath sounds normal.  Abdominal: Soft. Bowel sounds are normal.  Musculoskeletal: Normal range of motion.  Neurological: He is alert and oriented to person, place, and time.  Nursing note and vitals reviewed.   Urgent Care Course  Procedures (including critical care time)  Labs Review Labs Reviewed - No data to display  Imaging Review No results found.   Visual Acuity Review  Right Eye Distance:   Left Eye Distance:   Bilateral Distance:    Right Eye Near:   Left Eye Near:    Bilateral Near:         MDM   1. Rash   2. Allergic contact dermatitis due to other agents    Medrol dose pack as directed 4mg  #21 Hydroxyzine 25mg  one po q 6 hours prn #12      Lysbeth Penner, FNP 09/20/16 1256

## 2016-09-20 NOTE — ED Triage Notes (Signed)
Rash around torso and all extremities.  Rash itches sometimes, no pain.  Onset 4-5 days ago

## 2018-01-23 ENCOUNTER — Ambulatory Visit (HOSPITAL_COMMUNITY)
Admission: EM | Admit: 2018-01-23 | Discharge: 2018-01-23 | Disposition: A | Discharge status: Home / Self Care | Payer: No Typology Code available for payment source | Attending: Family Medicine | Admitting: Family Medicine

## 2018-01-23 ENCOUNTER — Ambulatory Visit (INDEPENDENT_AMBULATORY_CARE_PROVIDER_SITE_OTHER): Payer: Self-pay

## 2018-01-23 ENCOUNTER — Encounter (HOSPITAL_COMMUNITY): Payer: Self-pay

## 2018-01-23 DIAGNOSIS — S92354A Nondisplaced fracture of fifth metatarsal bone, right foot, initial encounter for closed fracture: Secondary | ICD-10-CM

## 2018-01-23 MED ORDER — NAPROXEN 500 MG PO TABS: 500.0000 mg | ORAL_TABLET | Freq: Two times a day (BID) | ORAL | 0 refills | Status: AC

## 2018-01-23 NOTE — Discharge Instructions (Signed)
It was nice meeting you!!  You have a fracture of the second toe.  We will give you a boot to wear.  Follow up with ortho as needed if symptoms do not improve or worsen.  Naproxen for pain and inflammation.

## 2018-01-23 NOTE — ED Provider Notes (Signed)
[name]    CSN: [id number] Arrival date & time: 01/23/18  0802     History   Chief Complaint Chief Complaint  Patient presents with  . Foot Pain    Right Foot    HPI Dustin Perry is a 57 y.o. male.   Patient is a 57 year old male that presents with right foot pain specifically more around the second and third toe.  This started after kicking a piece of furniture on Saturday.  He has had pain and swelling in the foot.  He has been using ice and ibuprofen.  He is concerned because he is still having increased pain.  He is having trouble walking and flexing the second and third toe.  He denies any numbness, tingling in the foot or toes.        Past Medical History:  Diagnosis Date  . History of nephrectomy    left, for donation    Patient Active Problem List   Diagnosis Date Noted  . Groin pain 04/07/2015  . Urine leukocytes 04/07/2015  . Prediabetes 03/18/2015  . Right cataract 03/17/2015  . Right inguinal hernia 03/17/2015  . Left inguinal hernia 04/26/2011    Past Surgical History:  Procedure Laterality Date  . INGUINAL HERNIA REPAIR Bilateral 08/06/2015   Procedure: LAPAROSCOPIC BILATERAL INGUINAL HERNIA REPAIR;  Surgeon: Michael Boston, MD;  Location: [facility];  Service: General;  Laterality: Bilateral;  . INSERTION OF MESH Bilateral 08/06/2015   Procedure: INSERTION OF MESH;  Surgeon: Michael Boston, MD;  Location: [facility];  Service: General;  Laterality: Bilateral;  . NEPHRECTOMY LIVING DONOR  1995  . SURGERY SCROTAL / TESTICULAR  2004   fatty mass removed        Home Medications    Prior to Admission medications   Medication Sig Start Date End Date Taking? Authorizing Provider  cyclobenzaprine (FLEXERIL) 10 MG tablet Take 1 tablet (10 mg total) by mouth 2 (two) times daily as needed for muscle spasms. 08/17/16   Barnet Glasgow, NP  diclofenac (VOLTAREN) 75 MG EC tablet Take 1 tablet (75 mg total) by mouth 2 (two) times daily. 08/17/16    Barnet Glasgow, NP  hydrOXYzine (ATARAX/VISTARIL) 25 MG tablet Take 1 tablet (25 mg total) by mouth every 6 (six) hours. 09/20/16   Lysbeth Penner, FNP  methylPREDNISolone (MEDROL DOSEPAK) 4 MG TBPK tablet Take 6-5-4-3-2-1 po qd 09/20/16   Lysbeth Penner, FNP  naproxen (NAPROSYN) 500 MG tablet Take 1 tablet (500 mg total) by mouth 2 (two) times daily. 01/23/18   Tri Chittick, Tressia Miners A, NP  fluticasone (FLONASE) 50 MCG/ACT nasal spray Place 2 sprays into the nose daily. 05/11/11 08/11/14  Billy Fischer, MD    Family History Family History  Problem Relation Age of Onset  . Asthma Mother     Social History Social History   Tobacco Use  . Smoking status: Former Research scientist (life sciences)  . Smokeless tobacco: Never Used  Substance Use Topics  . Alcohol use: Yes    Comment: occ moderate  . Drug use: Yes    Types: Marijuana    Comment: occ. use  last used on     Allergies   Patient has no known allergies.   Review of Systems Review of Systems  Constitutional: Negative for activity change.  Musculoskeletal: Positive for joint swelling.  Skin: Positive for color change.  Neurological: Negative for weakness and numbness.  Hematological: Does not bruise/bleed easily.  All other systems reviewed and are negative.  Physical Exam Triage Vital Signs ED Triage Vitals  Enc Vitals Group     BP 01/23/18 0818 (!) 144/97     Pulse Rate 01/23/18 0818 74     Resp 01/23/18 0818 20     Temp 01/23/18 0818 97.9 F (36.6 C)     Temp Source 01/23/18 0818 Oral     SpO2 01/23/18 0818 98 %     Weight --      Height --      Head Circumference --      Peak Flow --      Pain Score 01/23/18 0816 9     Pain Loc --      Pain Edu? --      Excl. in [place]? --    No data found.  Updated Vital Signs BP (!) 144/97 (BP Location: Right Arm)   Pulse 74   Temp 97.9 F (36.6 C) (Oral)   Resp 20   SpO2 98%   Visual Acuity Right Eye Distance:   Left Eye Distance:   Bilateral Distance:    Right Eye Near:   Left  Eye Near:    Bilateral Near:     Physical Exam  Constitutional: He appears well-developed and well-nourished.  HENT:  Head: Normocephalic and atraumatic.  Eyes: Pupils are equal, round, and reactive to light.  Neck: Normal range of motion. Neck supple.  Pulmonary/Chest: Effort normal.  Musculoskeletal: Normal range of motion.  Mild swelling and tenderness to second and third toes.  Bruising to dorsal foot.  Pulses and sensation intact.  Pain with flexion of toes  Neurological: He is alert.  Skin: Skin is warm.  Psychiatric: He has a normal mood and affect.  Nursing note and vitals reviewed.    UC Treatments / Results  Labs (all labs ordered are listed, but only abnormal results are displayed) Labs Reviewed - No data to display  EKG None  Radiology Dg Foot Complete Right  Result Date: 01/23/2018 CLINICAL DATA:  Second toe injury, hit on table.  Pain. EXAM: RIGHT FOOT COMPLETE - 3+ VIEW COMPARISON:  None. FINDINGS: Mild cortical irregularity noted at the base of the right 2nd toe proximal phalanx which may reflect subtle nondisplaced fracture. No subluxation or dislocation. No additional acute bony abnormality. IMPRESSION: Question subtle nondisplaced fracture at the base of the right 2nd toe proximal phalanx. Electronically Signed   By: Rolm Baptise M.D.   On: 01/23/2018 08:49    Procedures Procedures (including critical care time)  Medications Ordered in UC Medications - No data to display  Initial Impression / Assessment and Plan / UC Course  I have reviewed the triage vital signs and the nursing notes.  Pertinent labs & imaging results that were available during my care of the patient were reviewed by me and considered in my medical decision making (see chart for details).     Xray to rule out fracture.  Subtle non displaced fracture of the right second toe Post op boot, naproxen for pain.  [facility] Orth[facility] follow up as needed.  Final Clinical Impressions(s) / UC  Diagnoses   Final diagnoses:  Closed nondisplaced fracture of fifth metatarsal bone of right foot, initial encounter     Discharge Instructions     It was nice meeting you!!  You have a fracture of the second toe.  We will give you a boot to wear.  Follow up with ortho as needed if symptoms do not improve or worsen.  Naproxen for pain and  inflammation.     ED Prescriptions    Medication Sig Dispense Auth. Provider   naproxen (NAPROSYN) 500 MG tablet Take 1 tablet (500 mg total) by mouth 2 (two) times daily. 30 tablet Loura Halt A, NP     Controlled Substance Prescriptions [place] Controlled Substance Registry consulted? Not Applicable   Orvan July, NP 01/23/18 [phone]

## 2018-01-23 NOTE — ED Triage Notes (Signed)
Pt complains of right foot pain from slamming foot into furniture a few days ago
